# Patient Record
Sex: Male | Born: 1960 | Race: White | Hispanic: No | Marital: Married | Smoking: Former smoker
Health system: Southern US, Community
[De-identification: ages and names within clinical notes are randomized; demographics above are authoritative.]

## PROBLEM LIST (undated history)

## (undated) DIAGNOSIS — J40 Bronchitis, not specified as acute or chronic: Secondary | ICD-10-CM

## (undated) DIAGNOSIS — G540 Brachial plexus disorders: Secondary | ICD-10-CM

## (undated) DIAGNOSIS — M25511 Pain in right shoulder: Secondary | ICD-10-CM

## (undated) DIAGNOSIS — M25519 Pain in unspecified shoulder: Secondary | ICD-10-CM

## (undated) DIAGNOSIS — E1169 Type 2 diabetes mellitus with other specified complication: Principal | ICD-10-CM

## (undated) DIAGNOSIS — I10 Essential (primary) hypertension: Secondary | ICD-10-CM

## (undated) DIAGNOSIS — J418 Mixed simple and mucopurulent chronic bronchitis: Principal | ICD-10-CM

## (undated) DIAGNOSIS — E785 Hyperlipidemia, unspecified: Secondary | ICD-10-CM

## (undated) DIAGNOSIS — E119 Type 2 diabetes mellitus without complications: Secondary | ICD-10-CM

---

## 2010-07-05 ENCOUNTER — Encounter

## 2010-07-06 ENCOUNTER — Encounter

## 2014-07-05 ENCOUNTER — Emergency Department (HOSPITAL_COMMUNITY): Payer: BC Managed Care – PPO

## 2014-07-05 ENCOUNTER — Encounter (HOSPITAL_COMMUNITY): Payer: Self-pay | Admitting: Emergency Medicine

## 2014-07-05 ENCOUNTER — Encounter (HOSPITAL_COMMUNITY)
Admission: EM | Disposition: A | Discharge status: Home / Self Care | Payer: Self-pay | Source: Home / Self Care | Attending: Thoracic Surgery (Cardiothoracic Vascular Surgery)

## 2014-07-05 ENCOUNTER — Inpatient Hospital Stay (HOSPITAL_COMMUNITY)
Admission: EM | Admit: 2014-07-05 | Discharge: 2014-07-13 | DRG: 234 | Disposition: A | Discharge status: Home / Self Care | Payer: BC Managed Care – PPO | Attending: Thoracic Surgery (Cardiothoracic Vascular Surgery) | Admitting: Thoracic Surgery (Cardiothoracic Vascular Surgery)

## 2014-07-05 ENCOUNTER — Other Ambulatory Visit: Payer: Self-pay | Admitting: *Deleted

## 2014-07-05 ENCOUNTER — Inpatient Hospital Stay (HOSPITAL_COMMUNITY): Payer: BC Managed Care – PPO

## 2014-07-05 DIAGNOSIS — I2582 Chronic total occlusion of coronary artery: Secondary | ICD-10-CM | POA: Diagnosis present

## 2014-07-05 DIAGNOSIS — J9811 Atelectasis: Secondary | ICD-10-CM

## 2014-07-05 DIAGNOSIS — F17210 Nicotine dependence, cigarettes, uncomplicated: Secondary | ICD-10-CM | POA: Diagnosis present

## 2014-07-05 DIAGNOSIS — R079 Chest pain, unspecified: Secondary | ICD-10-CM | POA: Diagnosis present

## 2014-07-05 DIAGNOSIS — I214 Non-ST elevation (NSTEMI) myocardial infarction: Secondary | ICD-10-CM | POA: Diagnosis present

## 2014-07-05 DIAGNOSIS — Z01810 Encounter for preprocedural cardiovascular examination: Secondary | ICD-10-CM

## 2014-07-05 DIAGNOSIS — R000 Tachycardia, unspecified: Secondary | ICD-10-CM | POA: Diagnosis not present

## 2014-07-05 DIAGNOSIS — I509 Heart failure, unspecified: Secondary | ICD-10-CM | POA: Diagnosis present

## 2014-07-05 DIAGNOSIS — E785 Hyperlipidemia, unspecified: Secondary | ICD-10-CM | POA: Diagnosis present

## 2014-07-05 DIAGNOSIS — E1165 Type 2 diabetes mellitus with hyperglycemia: Secondary | ICD-10-CM | POA: Diagnosis present

## 2014-07-05 DIAGNOSIS — Z951 Presence of aortocoronary bypass graft: Secondary | ICD-10-CM

## 2014-07-05 DIAGNOSIS — I200 Unstable angina: Secondary | ICD-10-CM

## 2014-07-05 DIAGNOSIS — I10 Essential (primary) hypertension: Secondary | ICD-10-CM | POA: Diagnosis present

## 2014-07-05 DIAGNOSIS — E669 Obesity, unspecified: Secondary | ICD-10-CM

## 2014-07-05 DIAGNOSIS — I237 Postinfarction angina: Secondary | ICD-10-CM | POA: Diagnosis present

## 2014-07-05 DIAGNOSIS — R0602 Shortness of breath: Secondary | ICD-10-CM

## 2014-07-05 DIAGNOSIS — I25110 Atherosclerotic heart disease of native coronary artery with unstable angina pectoris: Secondary | ICD-10-CM | POA: Diagnosis present

## 2014-07-05 DIAGNOSIS — I2510 Atherosclerotic heart disease of native coronary artery without angina pectoris: Secondary | ICD-10-CM

## 2014-07-05 DIAGNOSIS — J9801 Acute bronchospasm: Secondary | ICD-10-CM | POA: Diagnosis present

## 2014-07-05 DIAGNOSIS — J449 Chronic obstructive pulmonary disease, unspecified: Secondary | ICD-10-CM | POA: Diagnosis present

## 2014-07-05 DIAGNOSIS — Z6837 Body mass index (BMI) 37.0-37.9, adult: Secondary | ICD-10-CM

## 2014-07-05 DIAGNOSIS — I359 Nonrheumatic aortic valve disorder, unspecified: Secondary | ICD-10-CM

## 2014-07-05 DIAGNOSIS — Z720 Tobacco use: Secondary | ICD-10-CM

## 2014-07-05 DIAGNOSIS — E119 Type 2 diabetes mellitus without complications: Secondary | ICD-10-CM

## 2014-07-05 HISTORY — DX: Type 2 diabetes mellitus without complications: E11.9

## 2014-07-05 HISTORY — PX: LEFT HEART CATHETERIZATION WITH CORONARY ANGIOGRAM: SHX5451

## 2014-07-05 HISTORY — DX: Hyperlipidemia, unspecified: E78.5

## 2014-07-05 LAB — GLUCOSE, CAPILLARY
GLUCOSE-CAPILLARY: 173 mg/dL — AB (ref 70–99)
GLUCOSE-CAPILLARY: 113 mg/dL — AB (ref 70–99)
Glucose-Capillary: 145 mg/dL — ABNORMAL HIGH (ref 70–99)
Glucose-Capillary: 129 mg/dL — ABNORMAL HIGH (ref 70–99)
Glucose-Capillary: 123 mg/dL — ABNORMAL HIGH (ref 70–99)
Glucose-Capillary: 147 mg/dL — ABNORMAL HIGH (ref 70–99)

## 2014-07-05 LAB — CBC WITH DIFFERENTIAL/PLATELET
Basophils Absolute: 0.1 10*3/uL (ref 0.0–0.1)
Basophils Relative: 1 % (ref 0–1)
EOS ABS: 0.2 10*3/uL (ref 0.0–0.7)
Eosinophils Relative: 2 % (ref 0–5)
HEMATOCRIT: 49.2 % (ref 39.0–52.0)
HEMOGLOBIN: 16.7 g/dL (ref 13.0–17.0)
LYMPHS ABS: 5.4 10*3/uL — AB (ref 0.7–4.0)
Lymphocytes Relative: 43 % (ref 12–46)
MCH: 31.8 pg (ref 26.0–34.0)
MCHC: 33.9 g/dL (ref 30.0–36.0)
MCV: 93.7 fL (ref 78.0–100.0)
MONOS PCT: 7 % (ref 3–12)
Monocytes Absolute: 0.9 10*3/uL (ref 0.1–1.0)
Neutro Abs: 6 10*3/uL (ref 1.7–7.7)
Neutrophils Relative %: 47 % (ref 43–77)
Platelets: 223 10*3/uL (ref 150–400)
RBC: 5.25 MIL/uL (ref 4.22–5.81)
RDW: 12.6 % (ref 11.5–15.5)
WBC: 12.6 10*3/uL — ABNORMAL HIGH (ref 4.0–10.5)

## 2014-07-05 LAB — PULMONARY FUNCTION TEST
FEF 25-75 PRE: 0.89 L/s
FEF 25-75 Post: 1.7 L/sec
FEF2575-%Change-Post: 91 %
FEF2575-%Pred-Post: 48 %
FEF2575-%Pred-Pre: 25 %
FEV1-%CHANGE-POST: 29 %
FEV1-%PRED-POST: 35 %
FEV1-%Pred-Pre: 27 %
FEV1-PRE: 1.13 L
FEV1-Post: 1.46 L
FEV1FVC-%Change-Post: -14 %
FEV1FVC-%Pred-Pre: 93 %
FEV6-%Change-Post: 49 %
FEV6-%PRED-POST: 46 %
FEV6-%PRED-PRE: 30 %
FEV6-POST: 2.35 L
FEV6-Pre: 1.57 L
FEV6FVC-%Change-Post: -1 %
FEV6FVC-%PRED-POST: 102 %
FEV6FVC-%PRED-PRE: 104 %
FVC-%Change-Post: 51 %
FVC-%Pred-Post: 44 %
FVC-%Pred-Pre: 29 %
FVC-POST: 2.38 L
FVC-PRE: 1.57 L
POST FEV6/FVC RATIO: 99 %
PRE FEV6/FVC RATIO: 100 %
Post FEV1/FVC ratio: 61 %
Pre FEV1/FVC ratio: 72 %

## 2014-07-05 LAB — BASIC METABOLIC PANEL
Anion gap: 14 (ref 5–15)
Anion gap: 11 (ref 5–15)
BUN: 10 mg/dL (ref 6–23)
BUN: 12 mg/dL (ref 6–23)
CO2: 26 mEq/L (ref 19–32)
CO2: 29 mEq/L (ref 19–32)
Calcium: 9.4 mg/dL (ref 8.4–10.5)
Calcium: 9.1 mg/dL (ref 8.4–10.5)
Chloride: 101 mEq/L (ref 96–112)
Chloride: 102 mEq/L (ref 96–112)
Creatinine, Ser: 0.7 mg/dL (ref 0.50–1.35)
Creatinine, Ser: 0.62 mg/dL (ref 0.50–1.35)
GFR calc Af Amer: >90 mL/min (ref >90)
GFR calc non Af Amer: >90 mL/min (ref >90)
GLUCOSE: 162 mg/dL — AB (ref 70–99)
Glucose, Bld: 119 mg/dL — ABNORMAL HIGH (ref 70–99)
POTASSIUM: 4.8 meq/L (ref 3.7–5.3)
Potassium: 4.1 mEq/L (ref 3.7–5.3)
Sodium: 141 mEq/L (ref 137–147)
Sodium: 142 mEq/L (ref 137–147)

## 2014-07-05 LAB — CBC
HCT: 45.2 % (ref 39.0–52.0)
Hemoglobin: 15.2 g/dL (ref 13.0–17.0)
MCH: 31.1 pg (ref 26.0–34.0)
MCHC: 33.6 g/dL (ref 30.0–36.0)
MCV: 92.6 fL (ref 78.0–100.0)
PLATELETS: 186 10*3/uL (ref 150–400)
RBC: 4.88 MIL/uL (ref 4.22–5.81)
RDW: 12.8 % (ref 11.5–15.5)
WBC: 8.4 10*3/uL (ref 4.0–10.5)

## 2014-07-05 LAB — I-STAT CHEM 8, ED
BUN: 12 mg/dL (ref 6–23)
CREATININE: 0.7 mg/dL (ref 0.50–1.35)
Calcium, Ion: 1.15 mmol/L (ref 1.12–1.23)
Chloride: 101 mEq/L (ref 96–112)
GLUCOSE: 165 mg/dL — AB (ref 70–99)
HCT: 52 % (ref 39.0–52.0)
HEMOGLOBIN: 17.7 g/dL — AB (ref 13.0–17.0)
Potassium: 3.9 mEq/L (ref 3.7–5.3)
Sodium: 141 mEq/L (ref 137–147)
TCO2: 28 mmol/L (ref 0–100)

## 2014-07-05 LAB — LIPID PANEL
Cholesterol: 168 mg/dL (ref 0–200)
HDL: 26 mg/dL — ABNORMAL LOW (ref >39)
LDL Cholesterol: 111 mg/dL — ABNORMAL HIGH (ref 0–99)
Total CHOL/HDL Ratio: 6.5 RATIO
Triglycerides: 154 mg/dL — ABNORMAL HIGH (ref <150)
VLDL: 31 mg/dL (ref 0–40)

## 2014-07-05 LAB — I-STAT TROPONIN, ED: Troponin i, poc: 0.01 ng/mL (ref 0.00–0.08)

## 2014-07-05 LAB — PROTIME-INR
INR: 1.1 (ref 0.00–1.49)
PROTHROMBIN TIME: 14.4 s (ref 11.6–15.2)

## 2014-07-05 LAB — HEMOGLOBIN A1C
Hgb A1c MFr Bld: 7.2 % — ABNORMAL HIGH (ref <5.7)
MEAN PLASMA GLUCOSE: 160 mg/dL — AB (ref <117)

## 2014-07-05 LAB — PRO B NATRIURETIC PEPTIDE
Pro B Natriuretic peptide (BNP): 10.7 pg/mL (ref 0–125)
Pro B Natriuretic peptide (BNP): 68.5 pg/mL (ref 0–125)

## 2014-07-05 LAB — TYPE AND SCREEN
ABO/RH(D): A POS
Antibody Screen: NEGATIVE

## 2014-07-05 LAB — ABO/RH: ABO/RH(D): A POS

## 2014-07-05 LAB — TSH: TSH: 0.72 u[IU]/mL (ref 0.350–4.500)

## 2014-07-05 LAB — MRSA PCR SCREENING: MRSA by PCR: NEGATIVE

## 2014-07-05 LAB — TROPONIN I
Troponin I: 18.09 ng/mL (ref <0.30)
Troponin I: 19.58 ng/mL (ref <0.30)
Troponin I: 18.08 ng/mL (ref <0.30)

## 2014-07-05 LAB — HEPARIN LEVEL (UNFRACTIONATED): Heparin Unfractionated: 0.35 IU/mL (ref 0.30–0.70)

## 2014-07-05 LAB — MAGNESIUM: Magnesium: 2 mg/dL (ref 1.5–2.5)

## 2014-07-05 SURGERY — LEFT HEART CATHETERIZATION WITH CORONARY ANGIOGRAM
Anesthesia: LOCAL

## 2014-07-05 MED ORDER — PANTOPRAZOLE SODIUM 40 MG PO TBEC
40.0000 mg | DELAYED_RELEASE_TABLET | Freq: Every day | ORAL | Status: DC
  Administered 2014-07-05 – 2014-07-06 (×2): 40 mg via ORAL
  Filled 2014-07-05 (×2): qty 1

## 2014-07-05 MED ORDER — METOPROLOL TARTRATE 1 MG/ML IV SOLN
2.5000 mg | INTRAVENOUS | Status: AC
  Administered 2014-07-05: 2.5 mg via INTRAVENOUS
  Filled 2014-07-05: qty 5

## 2014-07-05 MED ORDER — FENTANYL CITRATE 0.05 MG/ML IJ SOLN
INTRAMUSCULAR | Status: AC
  Filled 2014-07-05: qty 2

## 2014-07-05 MED ORDER — NITROGLYCERIN IN D5W 200-5 MCG/ML-% IV SOLN
0.0000 ug/min | INTRAVENOUS | Status: DC
  Administered 2014-07-05: 5 ug/min via INTRAVENOUS

## 2014-07-05 MED ORDER — HEPARIN SODIUM (PORCINE) 5000 UNIT/ML IJ SOLN
60.0000 [IU]/kg | Freq: Once | INTRAMUSCULAR | Status: AC
  Administered 2014-07-05: 4000 [IU] via INTRAVENOUS

## 2014-07-05 MED ORDER — HEPARIN SODIUM (PORCINE) 1000 UNIT/ML IJ SOLN
INTRAMUSCULAR | Status: AC
  Filled 2014-07-05: qty 1

## 2014-07-05 MED ORDER — MORPHINE SULFATE 2 MG/ML IJ SOLN
2.0000 mg | INTRAMUSCULAR | Status: DC | PRN
  Administered 2014-07-05: 2 mg via INTRAVENOUS
  Filled 2014-07-05 (×2): qty 1

## 2014-07-05 MED ORDER — METOPROLOL TARTRATE 1 MG/ML IV SOLN
2.5000 mg | INTRAVENOUS | Status: AC
  Administered 2014-07-05: 2.5 mg via INTRAVENOUS

## 2014-07-05 MED ORDER — ASPIRIN 81 MG PO CHEW: 324.0000 mg | CHEWABLE_TABLET | ORAL | Status: AC

## 2014-07-05 MED ORDER — NITROGLYCERIN 0.4 MG SL SUBL: 0.4000 mg | SUBLINGUAL_TABLET | SUBLINGUAL | Status: DC | PRN

## 2014-07-05 MED ORDER — HEPARIN SODIUM (PORCINE) 5000 UNIT/ML IJ SOLN
INTRAMUSCULAR | Status: AC
  Filled 2014-07-05: qty 1

## 2014-07-05 MED ORDER — ONDANSETRON HCL 4 MG/2ML IJ SOLN: 4.0000 mg | Freq: Four times a day (QID) | INTRAMUSCULAR | Status: DC | PRN

## 2014-07-05 MED ORDER — HEPARIN (PORCINE) IN NACL 2-0.9 UNIT/ML-% IJ SOLN
INTRAMUSCULAR | Status: AC
  Filled 2014-07-05: qty 1500

## 2014-07-05 MED ORDER — ALUM & MAG HYDROXIDE-SIMETH 200-200-20 MG/5ML PO SUSP: 15.0000 mL | ORAL | Status: DC | PRN

## 2014-07-05 MED ORDER — ASPIRIN EC 81 MG PO TBEC: 81.0000 mg | DELAYED_RELEASE_TABLET | Freq: Every day | ORAL | Status: DC

## 2014-07-05 MED ORDER — VERAPAMIL HCL 2.5 MG/ML IV SOLN
INTRAVENOUS | Status: AC
  Filled 2014-07-05: qty 2

## 2014-07-05 MED ORDER — ALUM & MAG HYDROXIDE-SIMETH 200-200-20 MG/5ML PO SUSP: 30.0000 mL | ORAL | Status: DC | PRN

## 2014-07-05 MED ORDER — HEPARIN (PORCINE) IN NACL 100-0.45 UNIT/ML-% IJ SOLN
1450.0000 [IU]/h | INTRAMUSCULAR | Status: DC
  Administered 2014-07-05: 1450 [IU]/h via INTRAVENOUS
  Filled 2014-07-05: qty 250

## 2014-07-05 MED ORDER — CHLORHEXIDINE GLUCONATE 0.12 % MT SOLN
15.0000 mL | Freq: Two times a day (BID) | OROMUCOSAL | Status: DC
  Administered 2014-07-05 – 2014-07-06 (×2): 15 mL via OROMUCOSAL
  Filled 2014-07-05 (×3): qty 15

## 2014-07-05 MED ORDER — MIDAZOLAM HCL 2 MG/2ML IJ SOLN
INTRAMUSCULAR | Status: AC
  Filled 2014-07-05: qty 2

## 2014-07-05 MED ORDER — NICOTINE 21 MG/24HR TD PT24
21.0000 mg | MEDICATED_PATCH | Freq: Every day | TRANSDERMAL | Status: DC
  Administered 2014-07-05 – 2014-07-13 (×8): 21 mg via TRANSDERMAL
  Filled 2014-07-05 (×9): qty 1

## 2014-07-05 MED ORDER — LORAZEPAM 2 MG/ML IJ SOLN
1.0000 mg | Freq: Once | INTRAMUSCULAR | Status: AC
  Administered 2014-07-05: 1 mg via INTRAVENOUS
  Filled 2014-07-05: qty 1

## 2014-07-05 MED ORDER — MORPHINE SULFATE 2 MG/ML IJ SOLN
INTRAMUSCULAR | Status: AC
  Filled 2014-07-05: qty 1

## 2014-07-05 MED ORDER — ASPIRIN 300 MG RE SUPP
300.0000 mg | RECTAL | Status: AC
  Filled 2014-07-05: qty 1

## 2014-07-05 MED ORDER — ONDANSETRON HCL 4 MG/2ML IJ SOLN
INTRAMUSCULAR | Status: AC
  Filled 2014-07-05: qty 2

## 2014-07-05 MED ORDER — METOPROLOL TARTRATE 12.5 MG HALF TABLET
12.5000 mg | ORAL_TABLET | Freq: Two times a day (BID) | ORAL | Status: DC
  Administered 2014-07-05 – 2014-07-06 (×5): 12.5 mg via ORAL
  Filled 2014-07-05 (×7): qty 1

## 2014-07-05 MED ORDER — ATORVASTATIN CALCIUM 80 MG PO TABS
80.0000 mg | ORAL_TABLET | Freq: Every day | ORAL | Status: DC
  Administered 2014-07-06 – 2014-07-12 (×6): 80 mg via ORAL
  Filled 2014-07-05 (×10): qty 1

## 2014-07-05 MED ORDER — LIDOCAINE HCL (PF) 1 % IJ SOLN
INTRAMUSCULAR | Status: AC
  Filled 2014-07-05: qty 30

## 2014-07-05 MED ORDER — MORPHINE SULFATE 2 MG/ML IJ SOLN
2.0000 mg | Freq: Once | INTRAMUSCULAR | Status: AC
  Administered 2014-07-05: 2 mg via INTRAVENOUS

## 2014-07-05 MED ORDER — HEPARIN (PORCINE) IN NACL 100-0.45 UNIT/ML-% IJ SOLN
1650.0000 [IU]/h | INTRAMUSCULAR | Status: DC
  Administered 2014-07-05: 1450 [IU]/h via INTRAVENOUS
  Filled 2014-07-05 (×5): qty 250

## 2014-07-05 MED ORDER — ALBUTEROL SULFATE (2.5 MG/3ML) 0.083% IN NEBU
2.5000 mg | INHALATION_SOLUTION | Freq: Once | RESPIRATORY_TRACT | Status: AC
  Administered 2014-07-05: 2.5 mg via RESPIRATORY_TRACT

## 2014-07-05 MED ORDER — ACETAMINOPHEN 325 MG PO TABS
650.0000 mg | ORAL_TABLET | ORAL | Status: DC | PRN
Start: 2014-07-05 — End: 2014-07-07

## 2014-07-05 MED ORDER — CETYLPYRIDINIUM CHLORIDE 0.05 % MT LIQD
7.0000 mL | Freq: Two times a day (BID) | OROMUCOSAL | Status: DC
  Administered 2014-07-05 (×2): 7 mL via OROMUCOSAL

## 2014-07-05 MED ORDER — NITROGLYCERIN 1 MG/10 ML FOR IR/CATH LAB
INTRA_ARTERIAL | Status: AC
  Filled 2014-07-05: qty 10

## 2014-07-05 MED ORDER — INSULIN ASPART 100 UNIT/ML ~~LOC~~ SOLN
0.0000 [IU] | Freq: Three times a day (TID) | SUBCUTANEOUS | Status: DC
  Administered 2014-07-05 – 2014-07-06 (×3): 2 [IU] via SUBCUTANEOUS

## 2014-07-05 MED ORDER — SODIUM CHLORIDE 0.9 % IV SOLN: INTRAVENOUS | Status: AC

## 2014-07-05 MED ORDER — ONDANSETRON HCL 4 MG/2ML IJ SOLN
4.0000 mg | Freq: Once | INTRAMUSCULAR | Status: AC
  Administered 2014-07-05: 4 mg via INTRAVENOUS

## 2014-07-05 MED ORDER — SODIUM CHLORIDE 0.9 % IV SOLN
INTRAVENOUS | Status: DC
  Administered 2014-07-05: 10 mL/h via INTRAVENOUS

## 2014-07-05 NOTE — Progress Notes (Addendum)
ANTICOAGULATION CONSULT NOTE - Follow Up Consult  Pharmacy Consult for Heparin Indication: Chest pain / ACS  No Known Allergies  Patient Measurements: Height: 6' (182.9 cm) Weight: 276 lb 3.8 oz (125.3 kg) IBW/kg (Calculated) : 77.6 Heparin Dosing Weight: 105 kg  Vital Signs: Temp: 97.6 F (36.4 C) (11/17 0713) Temp Source: Oral (11/17 0713) BP: 74/51 mmHg (11/17 0900) Pulse Rate: 73 (11/17 0900)  Labs:  Recent Labs  07/05/14 0041 07/05/14 0052 07/05/14 0850  HGB 16.7 17.7* 15.2  HCT 49.2 52.0 45.2  PLT 223  --  186  LABPROT  --   --  14.4  INR  --   --  1.10  HEPARINUNFRC  --   --  0.35  CREATININE 0.70 0.70 0.62  TROPONINI <0.30  --  18.09*    Estimated Creatinine Clearance: 146.1 mL/min (by C-G formula based on Cr of 0.62).  Assessment: 53 yo M presents on 11/17 with severe CP associated with SOB. EKG initially NSR, diffuse ST depressions in inferior leads, ST elevation in aVR. Tnl 0.01. CBC stable, Hgb slightly elevated at 17.7, plts 223. HL is therapeutic at 0.35. No s/s of bleed. Going for cath today.  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 Recommended for CABG. Plan is to restart heparin 8 hours post sheath removal which will occur at approximately 1330 today. HL was therapeutic at 0.35 earlier today with heparin running at 1,450 units/hr  Plan: No BOLUS Restart heparin gtt at 1,450 units/hr for 2130 tonight Check ~6 hour HL with am labs Monitor daily HL, CBC, s/s of bleed F/U CABG plans

## 2014-07-05 NOTE — H&P (Addendum)
Patient ID: Antonio MatesWilliam Cordova MRN: [id number], DOB/AGE: 18-Dec-1960   Admit date: 07/05/2014   Primary Physician: No primary care provider on file. Primary Cardiologist: None  Pt. Profile:  53M smoker with DM, HLD who p/w severe chest pain and diffuse ECG changes  Problem List  Past Medical History  Diagnosis Date  . Diabetes mellitus without complication   . Hyperlipidemia     History reviewed. No pertinent past surgical history.   Allergies  No Known Allergies  HPI  53M smoker with DM, HLD who p/w severe chest pain and diffuse ECG changes.  Antonio Cordova was in [place] went to bed and awoke at 11pm with centrally located crushing SSCP "it hurt really bad" with associated SOB. Initially 3/10 in severity. He has never had this type of symptom before. Pain 11/10 at its most severe. He did have left arm numbness but this is a chronic issue that occurred after a biceps surgery. He called EMS immediately. He was given ASA in the truck.   On arrival to the ED he was hemodynamically stable, saturating in the mid 90s on RA. ECG initially demonstrated NSR, diffuse ST depressions in inferior leads and V3-V6. STE aVR. old ASMI which improved slightly by the next ECG at 12:44am. He was started on IV heparin and IV NTG and was given morphine and zofran. Initial work-up notable for  K 3.9, Cr 0.70, POC TnI 0.01, CXR with mild pulm edema.   He denies a history of CP but states he does have DOE which occurs after walking 1/2 length of a football field. He can just get up a flight of 10 stairs without needing to take a rest. No history of bleeding or CVA. He has a mother and grandmother with DM. No CV disease among parents or siblings. Has smoked 1.5PPD for most of his life.   He is married and has a Microbiologiststepson. 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   On my interview he still reported 3/10 chest pain.   Home Medications  Prior to Admission medications     Not on File  Tylenol  Family History  History reviewed. No pertinent family history.  Social History  History   Social History  . Marital Status: Unknown    Spouse Name: N/A    Number of Children: N/A  . Years of Education: N/A   Occupational History  . Not on file.   Social History Main Topics  . Smoking status: Current Every Day Smoker -- 1.05 packs/day  . Smokeless tobacco: Never Used  . Alcohol Use: Yes  . Drug Use: Not on file  . Sexual Activity: Not on file   Other Topics Concern  . Not on file   Social History Narrative  . No narrative on file     Review of Systems General:  No chills, fever, night sweats or weight changes.  Cardiovascular:  + chest pain, dyspnea on exertion. No edema, orthopnea, palpitations, paroxysmal nocturnal dyspnea. Dermatological: No rash, lesions/masses Respiratory: No cough, dyspnea Urologic: No hematuria, dysuria Abdominal:   No nausea, vomiting, diarrhea, bright red blood per rectum, melena, or hematemesis Neurologic:  No visual changes, wkns, changes in mental status. All other systems reviewed and are otherwise negative except as noted above.  Physical Exam  Blood pressure 114/84, pulse 81, temperature 97.4 F (36.3 C), temperature source Oral, resp. rate 26, height 6' (1.829 m), weight 122.471 kg (270 lb), SpO2 94 %.  General: Moderate distress, fidgeting Psych: anxious Neuro: Alert and oriented X 3. Moves all extremities spontaneously. HEENT: Normal  Neck: Supple without bruits or JVD. Lungs:  Mildly tachypneic. Basilar crackles.  Heart: RRR no s3, s4, or murmurs. Abdomen: Soft, non-tender, non-distended, BS + x 4.  Extremities: No clubbing, cyanosis or edema. DP/PT/Radials 2+ and equal bilaterally.  Labs  Troponin [facility](Point of Care Test)  Recent Labs  07/05/14 0049  TROPIPOC 0.01   No results for input(s): CKTOTAL, CKMB, TROPONINI in the last 72 hours. Lab Results  Component Value Date   WBC 12.6* 07/05/2014    HGB 17.7* 07/05/2014   HCT 52.0 07/05/2014   MCV 93.7 07/05/2014   PLT 223 07/05/2014    Recent Labs Lab 07/05/14 0052  NA 141  K 3.9  CL 101  BUN 12  CREATININE 0.70  GLUCOSE 165*   No results found for: CHOL, HDL, LDLCALC, TRIG No results found for: DDIMER   Radiology/Studies  Dg Chest Port 1 View  07/05/2014   CLINICAL DATA:  Chest pain and shortness of breath  EXAM: PORTABLE CHEST - 1 VIEW  COMPARISON:  None.  FINDINGS: Cardiac shadow is within normal limits. Increased vascular congestion is noted without significant pulmonary edema. No focal infiltrate is seen. No bony abnormality is noted.  IMPRESSION: Mild vascular congestion.   Electronically Signed   By: Alcide CleverMark  Lukens M.D.   On: 07/05/2014 01:00    ECG 07/05/14 12:39 NSR, diffuse ST depressions in interior leads and V3-V6. STE aVR. old ASMI.  07/05/14 12:44 NSR, old ASMI, borderline downsloping STD in inferiorleads and V4-V6. TWI in V4-V6. TW flattening aVL  ASSESSMENT AND PLAN  53M smoker with DM, HLD who p/w severe chest pain and diffuse ECG changes. The presentation is very concerning for unstable anginal in the setting of LM, pLAD, or multivessel CAD based on presentation and ECG. His CXR and lung exam are consistent with mild CHF. He is not in shock and is improving somewhat with IV nitro and heparin. Will continue to try to cool off with medications but he may require emergent angiography overnight.   1. NPO for cardiac cath 2. Admit to ICU 3. Continue IV UFH and NTG 4. ASA 5. Metoprolol 12.5mg  BID 6. Atorvastatin 80mg  7. Echo ordered 8. A1c, lipids 9. Nicotine patch, smoking cessation counseling   Signed, Glori LuisFRIEDMAN, Arleigh Odowd, MD 07/05/2014, 1:08 AM

## 2014-07-05 NOTE — Progress Notes (Signed)
CRITICAL VALUE ALERT  Critical value receivd: Trop 18.09       Date of notification:  07/05/14  Time of notification:  1000  Critical value read back yes  Nurse who received alert: P. Mikle BosworthAlvord RN  MD notified (1st page): Dr. SwazilandJordan  Time of first page:  1000  MD notified (2nd page):  Time of second page:    Time MD responded:

## 2014-07-05 NOTE — CV Procedure (Signed)
    Cardiac Catheterization Procedure Note  Name: Antonio MatesWilliam Cordova MRN: [id number] DOB: Mar 11, 1961  Procedure: Left Heart Cath, Selective Coronary Angiography, LV angiography  Indication: 53 yo WM with history of DM presents with unstable angina.   Procedural Details: The right wrist was prepped, draped, and anesthetized with 1% lidocaine. Using the modified Seldinger technique, a 6 French slender sheath was introduced into the right radial artery. 3 mg of verapamil was administered through the sheath, weight-based unfractionated heparin was administered intravenously. Standard Judkins catheters were used for selective coronary angiography and left ventriculography. Catheter exchanges were performed over an exchange length guidewire. There were no immediate procedural complications. A TR band was used for radial hemostasis at the completion of the procedure.  The patient was transferred to the post catheterization recovery area for further monitoring.  Procedural Findings: Hemodynamics: AO 93/64 mean 77 mm Hg LV 95/20 mm Hg  Coronary angiography: Coronary dominance: right  Left mainstem: The left main is calcified with 30-40% distal disease.   Left anterior descending (LAD): The LAD is heavily calcified with severe diffuse  proximal disease up to 90%. There is a 90% stenosis at the takeoff of the second diagonal.   Ramus intermediate: this is a large branch with diffuse 80% stenosis in the proximal vessel and 70% stenosis in the mid vessel.   Left circumflex (LCx): 100% occluded at the ostium. There are left to left collaterals to an OM branch and right to left collaterals to the distal LCx  Right coronary artery (RCA): The RCA is heavily calcified. There is an 80% proximal stenosis followed by 70% disease in the mid vessel.   Left ventriculography: Left ventricular systolic function is abnormal, there is mid anterior HK. LVEF is estimated at 45%, there is no significant mitral  regurgitation   Final Conclusions:   1. Severe 3 vessel obstructive CAD  2. Mild to moderate LV dysfunction.  Recommendations: Recommend consideration for CABG.   Burlie Cajamarca SwazilandJordan, MDFACC  07/05/2014, 10:25 AM

## 2014-07-05 NOTE — Plan of Care (Signed)
Problem: Phase I Progression Outcomes Goal: Aspirin unless contraindicated Outcome: Completed/Met Date Met:  07/05/14

## 2014-07-05 NOTE — ED Notes (Addendum)
Patient with chest pain that woke him from sleep.  Patient was pale and diaphoretic on EMS arrival.  Patient with nausea and some moments of anxiety and altered mental status.  Patient is in [place] on business.  Patient was given 324mg  ASA and one SL nitro en route to ED.

## 2014-07-05 NOTE — Progress Notes (Signed)
VASCULAR LAB PRELIMINARY  PRELIMINARY  PRELIMINARY  PRELIMINARY  Pre-op Cardiac Surgery  Carotid Findings:  Bilateral:  1-39% ICA stenosis.  Vertebral artery flow is antegrade.     Antonio ParkinHelene Shivank Cordova, RVT 07/05/2014 2:02 PM     Upper Extremity Right Left  Brachial Pressures    Radial Waveforms Pending    Ulnar Waveforms    Palmar Arch ([name]'s Test)     Findings:      Lower  Extremity Right Left  Dorsalis Pedis    Anterior Tibial    Posterior Tibial    Ankle/Brachial Indices      Findings:

## 2014-07-05 NOTE — Progress Notes (Signed)
ANTICOAGULATION CONSULT NOTE - Initial Consult  Pharmacy Consult for Heparin Indication: chest pain/ACS  No Known Allergies  Patient Measurements: Height: 6' (182.9 cm) Weight: 270 lb (122.471 kg) IBW/kg (Calculated) : 77.6  Vital Signs: Temp: 97.4 F (36.3 C) (11/17 0036) Temp Source: Oral (11/17 0036) BP: 114/84 mmHg (11/17 0040) Pulse Rate: 81 (11/17 0041)  Labs:  Recent Labs  07/05/14 0041 07/05/14 0052  HGB 16.7 17.7*  HCT 49.2 52.0  PLT 223  --   CREATININE  --  0.70    Estimated Creatinine Clearance: 144.4 mL/min (by C-G formula based on Cr of 0.7).   Medical History: Past Medical History  Diagnosis Date  . Diabetes mellitus without complication   . Hyperlipidemia     Medications:  None  Assessment: 53 y.o. male with chest pain for heparin   Goal of Therapy:  Heparin level 0.3-0.7 units/ml Monitor platelets by anticoagulation protocol: Yes   Plan:  Heparin 4000 units IV bolus, then start heparin 1450 units/hr Check heparin level in 6 hours.   Eddie Candlebbott, Hamed Debella Vernon 07/05/2014,1:03 AM

## 2014-07-05 NOTE — Plan of Care (Signed)
Problem: Phase I Progression Outcomes Goal: Hemodynamically stable Outcome: Completed/Met Date Met:  07/05/14     

## 2014-07-05 NOTE — Plan of Care (Signed)
Problem: Phase I Progression Outcomes Goal: MD aware of Cardiac Marker results Outcome: Completed/Met Date Met:  07/05/14

## 2014-07-05 NOTE — Interval H&P Note (Signed)
History and Physical Interval Note:  07/05/2014 9:40 AM  Antonio MatesWilliam Casique  has presented today for surgery, with the diagnosis of cp  The various methods of treatment have been discussed with the patient and family. After consideration of risks, benefits and other options for treatment, the patient has consented to  Procedure(s): LEFT HEART CATHETERIZATION WITH CORONARY ANGIOGRAM (N/A) as a surgical intervention .  The patient's history has been reviewed, patient examined, no change in status, stable for surgery.  I have reviewed the patient's chart and labs.  Questions were answered to the patient's satisfaction.   Cath Lab Visit (complete for each Cath Lab visit)  Clinical Evaluation Leading to the Procedure:   ACS: Yes.    Non-ACS:    Anginal Classification: CCS IV  Anti-ischemic medical therapy: Minimal Therapy (1 class of medications)  Non-Invasive Test Results: No non-invasive testing performed  Prior CABG: No previous CABG        Theron Aristaeter Memorial Hospital Of Carbon CountyJordanMD,FACC 07/05/2014 9:40 AM

## 2014-07-05 NOTE — Progress Notes (Addendum)
Late entry from earlier this morning. The patient was seen on CCU rounds at approximately 8:45 AM.  Patient Name: Antonio Cordova Date of Encounter: 07/05/2014    SUBJECTIVE:  The patient was seen on rounds this morning and it had several bouts of chest pain since admission to the hospital. The patient's profile included significant risk factors for CAD. Despite normal cardiac markers since admission, his course was compatible with unstable angina pectoris. We set the patient up for coronary angiography after speaking with Dr. SwazilandJordan. Orders were written. The procedure and risks were discussed in detail with the patient.  TELEMETRY:  Normal sinus rhythm with occasional nonsustained runs of VT Filed Vitals:   07/05/14 1102 07/05/14 1200 07/05/14 1300 07/05/14 1400  BP: 114/79 107/76  87/68  Pulse: 73 87 72 85  Temp: 97.3 F (36.3 C)     TempSrc: Oral     Resp: 16 15 14 20   Height:      Weight:      SpO2: 97% 98% 98% 97%    Intake/Output Summary (Last 24 hours) at 07/05/14 1437 Last data filed at 07/05/14 1400  Gross per 24 hour  Intake 1013.91 ml  Output    850 ml  Net 163.91 ml   LABS: Basic Metabolic Panel:  Recent Labs  47/42/5911/17/15 0041 07/05/14 0052 07/05/14 0850  NA 141 141 142  K 4.1 3.9 4.8  CL 101 101 102  CO2 26  --  29  GLUCOSE 162* 165* 119*  BUN 12 12 10   CREATININE 0.70 0.70 0.62  CALCIUM 9.4  --  9.1  MG  --   --  2.0   CBC:  Recent Labs  07/05/14 0041 07/05/14 0052 07/05/14 0850  WBC 12.6*  --  8.4  NEUTROABS 6.0  --   --   HGB 16.7 17.7* 15.2  HCT 49.2 52.0 45.2  MCV 93.7  --  92.6  PLT 223  --  186   Cardiac Enzymes:  Recent Labs  07/05/14 0041 07/05/14 0850  TROPONINI <0.30 18.09*   BNP    Component Value Date/Time   PROBNP 68.5 07/05/2014 0850    Fasting Lipid Panel:  Recent Labs  07/05/14 0850  CHOL 168  HDL 26*  LDLCALC 111*  TRIG 154*  CHOLHDL 6.5    Radiology/Studies:  Chest x-ray reveals mild vascular  congestion  Physical Exam: Blood pressure 87/68, pulse 85, temperature 97.3 F (36.3 C), temperature source Oral, resp. rate 20, height 6' (1.829 m), weight 276 lb 3.8 oz (125.3 kg), SpO2 97 %. Weight change:   Wt Readings from Last 3 Encounters:  07/05/14 276 lb 3.8 oz (125.3 kg)    Audible S4 gallop on cardiac auscultation Faint bibasal rales are heard. Also coarse expiratory wheezes are heard. Difficult to evaluate neck veins. No peripheral edema. Radial and posterior tibial pulses are 2+ and symmetric Neurologically intact  ASSESSMENT:  1. Unstable angina pectoris with waxing and waning episodes of chest discomfort. Cardiac markers were negative at the time of initial evaluation when the decision to perform angiography was made. Subsequently, blood work drawn at the time that we were evaluating the patient has come back showing a troponin of 18.09. The patient remains pain free. 2. Diabetes mellitus, poorly controlled 3. Heavy tobacco abuse 4. Hypertension  Plan:  Angiograms were reviewed with Dr. SwazilandJordan. Given the patient's diabetes and the diffuse nature of coronary disease, I believe his best long-term option would be coronary revascularization  with surgery.  Continue IV heparin  Add anti-ischemic therapy to include IV nitroglycerin and beta blocker therapy.  Selinda EonSigned, [name],[name] 07/05/2014, 2:37 PM

## 2014-07-05 NOTE — Progress Notes (Signed)
  Echocardiogram 2D Echocardiogram has been performed.  Leta JunglingCooper, Lurlie Wigen M 07/05/2014, 4:35 PM

## 2014-07-05 NOTE — Care Management Note (Signed)
    Page 1 of 1   07/05/2014     8:55:58 AM CARE MANAGEMENT NOTE 07/05/2014  Patient:  Marcelline MatesMARTIN,Paolo   Account Number:  [id number]  Date Initiated:  07/05/2014  Documentation initiated by:  Junius CreamerWELL,DEBBIE  Subjective/Objective Assessment:   adm w angina     Action/Plan:   lives w fam   Anticipated DC Date:     Anticipated DC Plan:           Choice offered to / List presented to:             Status of service:   Medicare Important Message given?   (If response is "NO", the following Medicare IM given date fields will be blank) Date Medicare IM given:   Medicare IM given by:   Date Additional Medicare IM given:   Additional Medicare IM given by:    Discharge Disposition:    Per UR Regulation:  Reviewed for med. necessity/level of care/duration of stay  If discussed at Long Length of Stay Meetings, dates discussed:    Comments:

## 2014-07-05 NOTE — ED Notes (Signed)
The patient called because he was having chest pain.  Patient denies any medical history.  According to EMS the patient is here on business and he advised them the pain woke him out of his sleep.  Patient also had SOB, diaphoresis, ALOC, but denies any other symptoms.  EMS gave a breathing treatment, one nitro and 324mg  of Aspirin.

## 2014-07-05 NOTE — Progress Notes (Signed)
eLink Physician-Brief Progress Note Patient Name: Antonio MatesWilliam Cordova DOB: October 25, 1960 MRN: [id number]   Date of Service  07/05/2014  HPI/Events of Note  Antonio Antonio Cordova is a [age] M admitted with unstable angina and diffuse EKG changes.  CXR shows mild pulmonary edema.  Currently on heparin and nitroglycerin gtts.  CC is scheduled for this AM.  He is HD stable with adequate O2 sats and is not in any resp distress.  eICU Interventions  Plan: Continue to monitor Plan of care per cardiology     Intervention Category Evaluation Type: New Patient Evaluation  Antonio Cordova 07/05/2014, 3:21 AM

## 2014-07-05 NOTE — ED Provider Notes (Signed)
CSN: [id number]     Arrival date & time 07/05/14  0035 History   First MD Initiated Contact with Patient 07/05/14 0042     Chief Complaint  Patient presents with  . Chest Pain    The patient called because he was having chest pain.  Patient denies any medical history.     (Consider location/radiation/quality/duration/timing/severity/associated sxs/prior Treatment) HPI 53 yo male presents to the ER from home with complaint of chest pain that woke from sleep.  No prior h/o same.  Pt reports he tried to get up and move around but did not have improvement.  He is unsure when he woke.  Call to 911 at 1154 pm.  Pt has been diaphoretic with EMS, c/o central chest pain, sob.  Pt received 1 ntg and aspirin without improvement.  Pt does not see a doctor on a regular basis, was told "years ago" he had diabetes.  Pt smokesx 1.5 ppd.  Denies family history of CAD.  Pt reports over the last few days he has had DOE and some lower extremity edema.   History reviewed. No pertinent past medical history. History reviewed. No pertinent past surgical history. History reviewed. No pertinent family history. History  Substance Use Topics  . Smoking status: Current Every Day Smoker  . Smokeless tobacco: Not on file  . Alcohol Use: Not on file    Review of Systems  Unable to perform ROS: Acuity of condition      Allergies  Review of patient's allergies indicates no known allergies.  Home Medications   Prior to Admission medications   Not on File   BP 114/84 mmHg  Pulse 81  Temp(Src) 97.4 F (36.3 C) (Oral)  Resp 26  SpO2 94% Physical Exam  Constitutional: He is oriented to person, place, and time. He appears well-developed and well-nourished. He appears distressed.  HENT:  Head: Normocephalic and atraumatic.  Nose: Nose normal.  Mouth/Throat: Oropharynx is clear and moist.  Eyes: Conjunctivae and EOM are normal. Pupils are equal, round, and reactive to light.  Neck: Normal range of motion.  Neck supple. No JVD present. No tracheal deviation present. No thyromegaly present.  Cardiovascular: Normal rate, regular rhythm, normal heart sounds and intact distal pulses.  Exam reveals no gallop and no friction rub.   No murmur heard. Pulmonary/Chest: Effort normal. No stridor. No respiratory distress. He has no wheezes. He has rales. He exhibits no tenderness.  Abdominal: Soft. Bowel sounds are normal. He exhibits no distension and no mass. There is no tenderness. There is no rebound and no guarding.  Musculoskeletal: Normal range of motion. He exhibits no edema or tenderness.  Lymphadenopathy:    He has no cervical adenopathy.  Neurological: He is alert and oriented to person, place, and time. He displays normal reflexes. He exhibits normal muscle tone. Coordination normal.  Skin: Skin is warm. No rash noted. He is diaphoretic. No erythema. No pallor.  Psychiatric: He has a normal mood and affect. His behavior is normal. Judgment and thought content normal.  Nursing note and vitals reviewed.   ED Course  Procedures (including critical care time) Labs Review Labs Reviewed  CBC WITH DIFFERENTIAL - Abnormal; Notable for the following:    WBC 12.6 (*)    Lymphs Abs 5.4 (*)    All other components within normal limits  BASIC METABOLIC PANEL - Abnormal; Notable for the following:    Glucose, Bld 162 (*)    All other components within normal limits  GLUCOSE, CAPILLARY -  Abnormal; Notable for the following:    Glucose-Capillary 173 (*)    All other components within normal limits  I-STAT CHEM 8, ED - Abnormal; Notable for the following:    Glucose, Bld 165 (*)    Hemoglobin 17.7 (*)    All other components within normal limits  MRSA PCR SCREENING  PRO B NATRIURETIC PEPTIDE  TROPONIN I  BASIC METABOLIC PANEL  CBC  PROTIME-INR  HEMOGLOBIN A1C  TROPONIN I  TROPONIN I  TROPONIN I  TSH  MAGNESIUM  LIPID PANEL  PRO B NATRIURETIC PEPTIDE  HEPARIN LEVEL (UNFRACTIONATED)  I-STAT  TROPOININ, ED  I-STAT TROPOININ, ED  I-STAT TROPOININ, ED  TYPE AND SCREEN    Imaging Review Dg Chest Port 1 View  07/05/2014   CLINICAL DATA:  Chest pain and shortness of breath  EXAM: PORTABLE CHEST - 1 VIEW  COMPARISON:  None.  FINDINGS: Cardiac shadow is within normal limits. Increased vascular congestion is noted without significant pulmonary edema. No focal infiltrate is seen. No bony abnormality is noted.  IMPRESSION: Mild vascular congestion.   Electronically Signed   By: Alcide CleverMark  Lukens M.D.   On: 07/05/2014 01:00     EKG Interpretation   Date/Time:  Tuesday July 05 2014 00:39:21 EST Ventricular Rate:  71 PR Interval:  171 QRS Duration: 83 QT Interval:  359 QTC Calculation: 390 R Axis:   14 Text Interpretation:  Sinus rhythm Atrial premature complexes in couplets  Consider left atrial enlargement Low voltage, precordial leads  Anteroseptal infarct, old Repol abnrm, severe global ischemia (LM/MVD)  Baseline wander in lead(s) II III aVF V1 V2 V3 V4 V5 V6 No old tracing to  compare diffuse ST depression, concern for acute ischemia Confirmed by  Kevin Space  MD, Krist Rosenboom ([id number]) on 07/05/2014 12:45:26 AM     CRITICAL CARE Performed by: Olivia MackieTTER,Trenna Kiely M Total critical care time:60 min Critical care time was exclusive of separately billable procedures and treating other patients. Critical care was necessary to treat or prevent imminent or life-threatening deterioration. Critical care was time spent personally by me on the following activities: development of treatment plan with patient and/or surrogate as well as nursing, discussions with consultants, evaluation of patient's response to treatment, examination of patient, obtaining history from patient or surrogate, ordering and performing treatments and interventions, ordering and review of laboratory studies, ordering and review of radiographic studies, pulse oximetry and re-evaluation of patient's condition.  MDM   Final diagnoses:   Chest pain   53 yo male with acute onset of chest pain, having ongoing severe crushing central chest pain.  Denies back pain.  Concern for ACS, unstable angina.  Doubt dissection, less likely PE.    1:03 AM Case d/w on call cardiology, Dr Haskell RilingFreedman who will see in the ED for consult.  Plan for admission. Pt on NTG drip, heparin drip.  Pain improved with morphine, ativan.  Olivia Mackielga M Alondra Sahni, MD 07/05/14 [phone]

## 2014-07-05 NOTE — Plan of Care (Signed)
Problem: Consults Goal: Cardiac Cath Patient Education (See Patient Education module for education specifics.) Outcome: Completed/Met Date Met:  07/05/14 Goal: Diabetes Guidelines if Diabetic/Glucose > 140 If diabetic or lab glucose is > 140 mg/dl - Initiate Diabetes/Hyperglycemia Guidelines & Document Interventions  Outcome: Completed/Met Date Met:  07/05/14  Problem: Phase I Progression Outcomes Goal: Pain controlled with appropriate interventions Outcome: Completed/Met Date Met:  07/05/14 Goal: Voiding-avoid urinary catheter unless indicated Outcome: Completed/Met Date Met:  07/05/14 Goal: Hemodynamically stable Outcome: Completed/Met Date Met:  07/05/14 Goal: Distal pulses equal to baseline Outcome: Completed/Met Date Met:  07/05/14 Goal: Vascular site scale level 0 - I Vascular Site Scale Level 0: No bruising/bleeding/hematoma Level I (Mild): Bruising/Ecchymosis, minimal bleeding/ooozing, palpable hematoma < 3 cm Level II (Moderate): Bleeding not affecting hemodynamic parameters, pseudoaneurysm, palpable hematoma > 3 cm Level III (Severe) Bleeding which affects hemodynamic parameters or retroperitoneal hemorrhage  Outcome: Completed/Met Date Met:  07/05/14

## 2014-07-06 DIAGNOSIS — J449 Chronic obstructive pulmonary disease, unspecified: Secondary | ICD-10-CM

## 2014-07-06 DIAGNOSIS — I25110 Atherosclerotic heart disease of native coronary artery with unstable angina pectoris: Secondary | ICD-10-CM

## 2014-07-06 LAB — CBC
HEMATOCRIT: 44.2 % (ref 39.0–52.0)
HEMOGLOBIN: 14.4 g/dL (ref 13.0–17.0)
MCH: 30.6 pg (ref 26.0–34.0)
MCHC: 32.6 g/dL (ref 30.0–36.0)
MCV: 93.8 fL (ref 78.0–100.0)
Platelets: 165 10*3/uL (ref 150–400)
RBC: 4.71 MIL/uL (ref 4.22–5.81)
RDW: 12.9 % (ref 11.5–15.5)
WBC: 7.7 10*3/uL (ref 4.0–10.5)

## 2014-07-06 LAB — GLUCOSE, CAPILLARY
GLUCOSE-CAPILLARY: 144 mg/dL — AB (ref 70–99)
Glucose-Capillary: 135 mg/dL — ABNORMAL HIGH (ref 70–99)
Glucose-Capillary: 123 mg/dL — ABNORMAL HIGH (ref 70–99)
Glucose-Capillary: 133 mg/dL — ABNORMAL HIGH (ref 70–99)

## 2014-07-06 LAB — COMPREHENSIVE METABOLIC PANEL
ALK PHOS: 59 U/L (ref 39–117)
ALT: 36 U/L (ref 0–53)
AST: 87 U/L — ABNORMAL HIGH (ref 0–37)
Albumin: 3.5 g/dL (ref 3.5–5.2)
Anion gap: 14 (ref 5–15)
BILIRUBIN TOTAL: 0.5 mg/dL (ref 0.3–1.2)
BUN: 7 mg/dL (ref 6–23)
CHLORIDE: 101 meq/L (ref 96–112)
CO2: 22 mEq/L (ref 19–32)
Calcium: 8.8 mg/dL (ref 8.4–10.5)
Creatinine, Ser: 0.61 mg/dL (ref 0.50–1.35)
GFR calc non Af Amer: >90 mL/min (ref >90)
GLUCOSE: 122 mg/dL — AB (ref 70–99)
POTASSIUM: 4.7 meq/L (ref 3.7–5.3)
SODIUM: 137 meq/L (ref 137–147)
Total Protein: 6.1 g/dL (ref 6.0–8.3)

## 2014-07-06 LAB — HIV ANTIBODY (ROUTINE TESTING W REFLEX): HIV 1&2 Ab, 4th Generation: NONREACTIVE

## 2014-07-06 LAB — HEPARIN LEVEL (UNFRACTIONATED)
HEPARIN UNFRACTIONATED: 0.39 [IU]/mL (ref 0.30–0.70)
Heparin Unfractionated: 0.18 IU/mL — ABNORMAL LOW (ref 0.30–0.70)

## 2014-07-06 MED ORDER — DEXTROSE 5 % IV SOLN
1.5000 g | INTRAVENOUS | Status: AC
  Administered 2014-07-07: 1.5 g via INTRAVENOUS
  Administered 2014-07-07: .75 g via INTRAVENOUS
  Filled 2014-07-06: qty 1.5

## 2014-07-06 MED ORDER — VANCOMYCIN HCL 10 G IV SOLR
1250.0000 mg | INTRAVENOUS | Status: AC
  Administered 2014-07-07: 1250 mg via INTRAVENOUS
  Filled 2014-07-06: qty 1250

## 2014-07-06 MED ORDER — MAGNESIUM SULFATE 50 % IJ SOLN
40.0000 meq | INTRAMUSCULAR | Status: DC
  Filled 2014-07-06: qty 10

## 2014-07-06 MED ORDER — BISACODYL 5 MG PO TBEC
10.0000 mg | DELAYED_RELEASE_TABLET | Freq: Once | ORAL | Status: AC
  Administered 2014-07-06: 10 mg via ORAL
  Filled 2014-07-06: qty 2

## 2014-07-06 MED ORDER — SODIUM CHLORIDE 0.9 % IV SOLN
INTRAVENOUS | Status: AC
  Administered 2014-07-07: 2 [IU]/h via INTRAVENOUS
  Filled 2014-07-06: qty 2.5

## 2014-07-06 MED ORDER — DEXTROSE 5 % IV SOLN
0.0000 ug/min | INTRAVENOUS | Status: DC
  Filled 2014-07-06: qty 4

## 2014-07-06 MED ORDER — PLASMA-LYTE 148 IV SOLN
INTRAVENOUS | Status: AC
  Administered 2014-07-07: 500 mL
  Filled 2014-07-06: qty 2.5

## 2014-07-06 MED ORDER — CHLORHEXIDINE GLUCONATE CLOTH 2 % EX PADS
6.0000 | MEDICATED_PAD | Freq: Once | CUTANEOUS | Status: AC
  Administered 2014-07-06: 6 via TOPICAL

## 2014-07-06 MED ORDER — DEXMEDETOMIDINE HCL IN NACL 400 MCG/100ML IV SOLN
0.1000 ug/kg/h | INTRAVENOUS | Status: AC
Start: 2014-07-07 — End: 2014-07-07
  Administered 2014-07-07: 0.7 ug/kg/h via INTRAVENOUS
  Filled 2014-07-06: qty 100

## 2014-07-06 MED ORDER — SODIUM CHLORIDE 0.9 % IV SOLN
INTRAVENOUS | Status: AC
  Administered 2014-07-07: 69 mL/h via INTRAVENOUS
  Filled 2014-07-06: qty 40

## 2014-07-06 MED ORDER — NITROGLYCERIN IN D5W 200-5 MCG/ML-% IV SOLN
2.0000 ug/min | INTRAVENOUS | Status: DC
  Filled 2014-07-06: qty 250

## 2014-07-06 MED ORDER — DIAZEPAM 5 MG PO TABS
5.0000 mg | ORAL_TABLET | Freq: Once | ORAL | Status: AC
  Administered 2014-07-07: 5 mg via ORAL
  Filled 2014-07-06: qty 1

## 2014-07-06 MED ORDER — DEXTROSE 5 % IV SOLN
750.0000 mg | INTRAVENOUS | Status: DC
  Filled 2014-07-06 (×2): qty 750

## 2014-07-06 MED ORDER — ASPIRIN 325 MG PO TABS
325.0000 mg | ORAL_TABLET | Freq: Every day | ORAL | Status: DC
  Administered 2014-07-06: 325 mg via ORAL
  Filled 2014-07-06 (×2): qty 1

## 2014-07-06 MED ORDER — ALPRAZOLAM 0.25 MG PO TABS: 0.2500 mg | ORAL_TABLET | ORAL | Status: DC | PRN

## 2014-07-06 MED ORDER — LEVALBUTEROL HCL 0.63 MG/3ML IN NEBU: 0.6300 mg | INHALATION_SOLUTION | Freq: Four times a day (QID) | RESPIRATORY_TRACT | Status: DC | PRN

## 2014-07-06 MED ORDER — DOPAMINE-DEXTROSE 3.2-5 MG/ML-% IV SOLN
0.0000 ug/kg/min | INTRAVENOUS | Status: DC
  Filled 2014-07-06: qty 250

## 2014-07-06 MED ORDER — TEMAZEPAM 15 MG PO CAPS: 15.0000 mg | ORAL_CAPSULE | Freq: Once | ORAL | Status: AC | PRN

## 2014-07-06 MED ORDER — LEVALBUTEROL HCL 0.63 MG/3ML IN NEBU
0.6300 mg | INHALATION_SOLUTION | Freq: Three times a day (TID) | RESPIRATORY_TRACT | Status: AC
  Administered 2014-07-06 (×3): 0.63 mg via RESPIRATORY_TRACT
  Filled 2014-07-06 (×3): qty 3

## 2014-07-06 MED ORDER — SODIUM CHLORIDE 0.9 % IV SOLN
INTRAVENOUS | Status: DC
  Filled 2014-07-06: qty 30

## 2014-07-06 MED ORDER — METOPROLOL TARTRATE 12.5 MG HALF TABLET
12.5000 mg | ORAL_TABLET | Freq: Once | ORAL | Status: AC
  Administered 2014-07-07: 12.5 mg via ORAL
  Filled 2014-07-06: qty 1

## 2014-07-06 MED ORDER — POTASSIUM CHLORIDE 2 MEQ/ML IV SOLN
80.0000 meq | INTRAVENOUS | Status: DC
  Filled 2014-07-06: qty 40

## 2014-07-06 MED ORDER — BISACODYL 5 MG PO TBEC: 5.0000 mg | DELAYED_RELEASE_TABLET | Freq: Once | ORAL | Status: DC

## 2014-07-06 MED ORDER — PHENYLEPHRINE HCL 10 MG/ML IJ SOLN
30.0000 ug/min | INTRAVENOUS | Status: AC
  Administered 2014-07-07: 20 ug/min via INTRAVENOUS
  Filled 2014-07-06: qty 2

## 2014-07-06 NOTE — Progress Notes (Signed)
Pt's beard  Clipped ( w/electric razor) to bilat :  cheeks and neck areas ( for ETT  securement  and c-line  Area ) . Pre- and post CABG procedures reviewed .

## 2014-07-06 NOTE — Progress Notes (Signed)
1 Day Post-Op Procedure(s) (LRB): LEFT HEART CATHETERIZATION WITH CORONARY ANGIOGRAM (N/A) Subjective: No complaints at present  Objective: Vital signs in last 24 hours: Temp:  [97.6 F (36.4 C)-98.6 F (37 C)] 98.6 F (37 C) (11/18 1557) Pulse Rate:  [69-90] 78 (11/18 1557) Cardiac Rhythm:  [-] Normal sinus rhythm (11/18 0800) Resp:  [13-28] 16 (11/18 1557) BP: (97-129)/(57-87) 105/68 mmHg (11/18 1557) SpO2:  [92 %-98 %] 92 % (11/18 1557)  Hemodynamic parameters for last 24 hours:    Intake/Output from previous day: 11/17 0701 - 11/18 0700 In: 1532.5 [P.O.:600; I.V.:932.5] Out: 2050 [Urine:2050] Intake/Output this shift: Total I/O In: 327.5 [P.O.:180; I.V.:147.5] Out: 775 [Urine:775]  General appearance: alert and no distress Neurologic: intact Heart: regular rate and rhythm Lungs: diminished breath sounds bilaterally Abdomen: normal findings: soft, non-tender Extremities: Abnormal [name]'s test on left  Lab Results:  Recent Labs  07/05/14 0850 07/06/14 0333  WBC 8.4 7.7  HGB 15.2 14.4  HCT 45.2 44.2  PLT 186 165   BMET:  Recent Labs  07/05/14 0850 07/06/14 0333  NA 142 137  K 4.8 4.7  CL 102 101  CO2 29 22  GLUCOSE 119* 122*  BUN 10 7  CREATININE 0.62 0.61  CALCIUM 9.1 8.8    PT/INR:  Recent Labs  07/05/14 0850  LABPROT 14.4  INR 1.10   ABG    Component Value Date/Time   TCO2 28 07/05/2014 0052   CBG (last 3)   Recent Labs  07/05/14 2243 07/06/14 0720 07/06/14 1106  GLUCAP 147* 135* 123*    Assessment/Plan: S/P Procedure(s) (LRB): LEFT HEART CATHETERIZATION WITH CORONARY ANGIOGRAM (N/A) FOR CABG in AM  I reassessed his [name]'s test with better lighting today. He does not get all the blood out of the capillary bed due to weakness in the left hand. He has an abnormal test with refill only when the radial is released. He can actually feel the blood flow return when radial released. Therefore, will not use radial.  He is aware of  the risks/ benefits of CABG and agrees to proceed  For OR tomorrow   LOS: 1 day    Kyleena Scheirer C 07/06/2014

## 2014-07-06 NOTE — Progress Notes (Signed)
ANTICOAGULATION CONSULT NOTE   Pharmacy Consult for Heparin Indication: chest pain/ACS  No Known Allergies  Patient Measurements: Height: 6' (182.9 cm) Weight: 276 lb 3.8 oz (125.3 kg) IBW/kg (Calculated) : 77.6  Vital Signs: Temp: 97.6 F (36.4 C) (11/18 0000) Temp Source: Oral (11/18 0000) BP: 122/69 mmHg (11/18 0300) Pulse Rate: 74 (11/18 0300)  Labs:  Recent Labs  07/05/14 0041 07/05/14 0052 07/05/14 0850 07/05/14 1500 07/05/14 2030 07/06/14 0333  HGB 16.7 17.7* 15.2  --   --  14.4  HCT 49.2 52.0 45.2  --   --  44.2  PLT 223  --  186  --   --  165  LABPROT  --   --  14.4  --   --   --   INR  --   --  1.10  --   --   --   HEPARINUNFRC  --   --  0.35  --   --  0.18*  CREATININE 0.70 0.70 0.62  --   --   --   TROPONINI <0.30  --  18.09* 19.58* 18.08*  --     Estimated Creatinine Clearance: 146.1 mL/min (by C-G formula based on Cr of 0.62).  Assessment: 53 y.o. male with chest pain, s/p cath and awaiting possible CABG for heparin   Goal of Therapy:  Heparin level 0.3-0.7 units/ml Monitor platelets by anticoagulation protocol: Yes   Plan:  Increase Heparin 1650 units/hr Check heparin level in 6 hours.  Antonio Cordova, Antonio Cordova 07/06/2014,4:47 AM

## 2014-07-06 NOTE — Progress Notes (Signed)
[id number] Pt had [facility] booklet, gave care guide and encouraged him have wife read it also. Discussed sternal precautions and demonstrated  how to get up and down without use of arms. Discussed importance of mobility and IS after surgery. Pt has nicotine patch on now but is not sure he is going to quit. Will address this again closer to discharge. Pt stated his wife is taking care of both of their mothers so he is unsure if she can be with him after discharge. Case manager will need to address with pt as he may need rehab at discharge. We will follow up after surgery. Luetta NuttingCharlene Dyna Figuereo RN BSN 07/06/2014 2:55 PM

## 2014-07-06 NOTE — Progress Notes (Addendum)
Patient Name: Antonio Cordova Date of Encounter: 07/06/2014    SUBJECTIVE:Unfortunately but not surprisingly, the patient had severe diffuse multivessel coronary disease that would be best treated with surgery. He has met Dr. Roxan Hockey. He has no questions this morning. He has had very mild waxing and waning episodes of chest discomfort. He denies dyspnea.  TELEMETRY:  Normal sinus rhythm with marked reduction in ventricular ectopy that was noted on rounds yesterday. Filed Vitals:   07/06/14 1000 07/06/14 1100 07/06/14 1102 07/06/14 1129  BP: 113/72 119/76 119/76   Pulse: 84 87 88   Temp:   98.1 F (36.7 C)   TempSrc:   Oral   Resp: _0 Height:      Weight:      SpO2: 96% 93% 97% 94%    Intake/Output Summary (Last 24 hours) at 07/06/14 1158 Last data filed at 07/06/14 1100  Gross per 24 hour  Intake 1556.47 ml  Output   2475 ml  Net -918.53 ml   LABS: Basic Metabolic Panel:  Recent Labs  07/05/14 0850 07/06/14 0333  NA 142 137  K 4.8 4.7  CL 102 101  CO2 29 22  GLUCOSE 119* 122*  BUN 10 7  CREATININE 0.62 0.61  CALCIUM 9.1 8.8  MG 2.0  --    CBC:  Recent Labs  07/05/14 0041  07/05/14 0850 07/06/14 0333  WBC 12.6*  --  8.4 7.7  NEUTROABS 6.0  --   --   --   HGB 16.7  < > 15.2 14.4  HCT 49.2  < > 45.2 44.2  MCV 93.7  --  92.6 93.8  PLT 223  --  186 165  < > = values in this interval not displayed. Cardiac Enzymes:  Recent Labs  07/05/14 0850 07/05/14 1500 07/05/14 2030  TROPONINI 18.09* 19.58* 18.08*   BNP: Invalid input(s): POCBNP Hemoglobin A1C:  Recent Labs  07/05/14 0850  HGBA1C 7.2*   Fasting Lipid Panel:  Recent Labs  07/05/14 0850  CHOL 168  HDL 26*  LDLCALC 111*  TRIG 154*  CHOLHDL 6.5   BNP    Component Value Date/Time   PROBNP 68.5 07/05/2014 0850    Radiology/Studies:  No new data  Physical Exam: Blood pressure 119/76, pulse 88, temperature 98.1 F (36.7 C), temperature source Oral, resp.  rate 20, height 6' (1.829 m), weight 276 lb 3.8 oz (125.3 kg), SpO2 94 %. Weight change:   Wt Readings from Last 3 Encounters:  07/05/14 276 lb 3.8 oz (125.3 kg)   Marked obesity. Chest demonstrates scattered wheezes on expiration. No rales are heard. Cardiac exam reveals no murmur. An S4 gallop is audible Extremities reveal no edema. Right radial cath site is unremarkable. Neurological exam is unremarkable.  ASSESSMENT:  1. Non-ST elevation myocardial infarction with postinfarction angina, currently pain-free on IV nitroglycerin and heparin. 2. Severe multivessel diffuse coronary disease, slated for surgical revascularization on 07/07/2014 3. COPD with wheezing 4. Marked obesity  Plan:  1. IV nitroglycerin and titrate to pain control 2. Optimize beta blocker therapy 3. Continue IV heparin 4. Cautioned to communicate with his nurse if any prolonged episodes of chest discomfort 5. For surgical revascularization tomorrow, or earlier if prolonged pain unrelieved by medication manipulation. 6. We'll attempt beta-2 selective therapy of bronchospasm and hopefully avoid aggravation of anginal symptoms  Signed, Sinclair Grooms 07/06/2014, 11:58 AM

## 2014-07-06 NOTE — Progress Notes (Signed)
Pt using incentive spirometer correctly  to 1750cc mark .

## 2014-07-06 NOTE — Progress Notes (Signed)
ANTICOAGULATION CONSULT NOTE   Pharmacy Consult for Heparin Indication: chest pain/ACS  No Known Allergies  Patient Measurements: Height: 6' (182.9 cm) Weight: 276 lb 3.8 oz (125.3 kg) IBW/kg (Calculated) : 77.6  Vital Signs: Temp: 98.1 F (36.7 C) (11/18 1102) Temp Source: Oral (11/18 1102) BP: 100/60 mmHg (11/18 1300) Pulse Rate: 86 (11/18 1300)  Labs:  Recent Labs  07/05/14 0041 07/05/14 0052 07/05/14 0850 07/05/14 1500 07/05/14 2030 07/06/14 0333 07/06/14 1149  HGB 16.7 17.7* 15.2  --   --  14.4  --   HCT 49.2 52.0 45.2  --   --  44.2  --   PLT 223  --  186  --   --  165  --   LABPROT  --   --  14.4  --   --   --   --   INR  --   --  1.10  --   --   --   --   HEPARINUNFRC  --   --  0.35  --   --  0.18* 0.39  CREATININE 0.70 0.70 0.62  --   --  0.61  --   TROPONINI <0.30  --  18.09* 19.58* 18.08*  --   --     Estimated Creatinine Clearance: 146.1 mL/min (by C-G formula based on Cr of 0.61).  Assessment: 53 y.o. male with chest pain, s/p cath and awaiting possible CABG for heparin.  Coag: ACS, heparin infusing at 1650 units/hr, heparin level at goal.  CBC wnl, no bleeding noted.  Goal of Therapy:  Heparin level 0.3-0.7 units/ml Monitor platelets by anticoagulation protocol: Yes   Plan:  Continue Heparin 1650 units/hr Daily heparin level and CBC Note plan to go for surgical revascularization on 11/19  Thank you for allowing pharmacy to be a part of this patients care team.  Lovenia KimJulie Jasun Gasparini Pharm.D., BCPS, AQ-Cardiology Clinical Pharmacist 07/06/2014 1:38 PM Pager: [phone] Phone: [phone]

## 2014-07-06 NOTE — Progress Notes (Signed)
Pt and spouse watching video #112 ( pre-CABG ) .

## 2014-07-06 NOTE — Consult Note (Signed)
Reason for Consult:3 vessel CAD s/p NQWMI Referring Physician: Dr. [place]  Mercury Fricke is an 53 y.o. male.  HPI: 53 yo man presented with a cc/o chest pain  Mr. Zellars is a 53 yo man with type II DM and a history of tobacco abuse who was awakened from his sleep at ~ 11PM on 11/16 with substernal chest pain. He described this a a sharp pain like being stabbed. He was also short of breath. He called EMS and was brought to the ED.  His initial ECG showed demonstrated diffuse ST depressions in inferior leads and V3-V6. There was an old ASMI. He was started on IV heparin and IV NTG and was given morphine and zofran. His ST depression improved. His initial troponin was 0.01, but he subsequently ruled in for an MI with a troponin of 19. His CXR showed mild pulm edema.   He denies any previous history of CAD or chest pain. He does have DOE which occurs after walking approximately 50 yards on level ground. He can just get up a flight of 10 stairs without needing to take a rest. No family history of heart disease among parents or siblings. He smokes 1.5 PPD. He says he does frequently have wheezing.  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 EF was 45 % with anterior hypokinesis. An echo showed no significant valvular pathology.   Past Medical History  Diagnosis Date  . Diabetes mellitus without complication   . Hyperlipidemia     History reviewed. No pertinent past surgical history.  History reviewed. No pertinent family history.- No family history of CAD, + DM M and MGM  Social History:  reports that he has been smoking.  He has never used smokeless tobacco. He reports that he drinks alcohol. His drug history is not on file.  Allergies: No Known Allergies  Medications:  Prior to Admission:  Prescriptions prior to admission  Medication Sig  Dispense Refill Last Dose  . Acetaminophen (TYLENOL PO) Take 2 tablets by mouth daily as needed (pain).   Past Week at Unknown time    Results for orders placed or performed during the hospital encounter of 07/05/14 (from the past 48 hour(s))  CBC with Differential     Status: Abnormal   Collection Time: 07/05/14 12:41 AM  Result Value Ref Range   WBC 12.6 (H) 4.0 - 10.5 K/uL   RBC 5.25 4.22 - 5.81 MIL/uL   Hemoglobin 16.7 13.0 - 17.0 g/dL   HCT 49.2 39.0 - 52.0 %   MCV 93.7 78.0 - 100.0 fL   MCH 31.8 26.0 - 34.0 pg   MCHC 33.9 30.0 - 36.0 g/dL   RDW 12.6 11.5 - 15.5 %   Platelets 223 150 - 400 K/uL   Neutrophils Relative % 47 43 - 77 %   Neutro Abs 6.0 1.7 - 7.7 K/uL   Lymphocytes Relative 43 12 - 46 %   Lymphs Abs 5.4 (H) 0.7 - 4.0 K/uL   Monocytes Relative 7 3 - 12 %   Monocytes Absolute 0.9 0.1 - 1.0 K/uL   Eosinophils Relative 2 0 - 5 %   Eosinophils Absolute 0.2 0.0 - 0.7 K/uL   Basophils Relative 1 0 - 1 %   Basophils Absolute 0.1 0.0 - 0.1 K/uL  Basic metabolic panel     Status:  Abnormal   Collection Time: 07/05/14 12:41 AM  Result Value Ref Range   Sodium 141 137 - 147 mEq/L   Potassium 4.1 3.7 - 5.3 mEq/L   Chloride 101 96 - 112 mEq/L   CO2 26 19 - 32 mEq/L   Glucose, Bld 162 (H) 70 - 99 mg/dL   BUN 12 6 - 23 mg/dL   Creatinine, Ser 0.70 0.50 - 1.35 mg/dL   Calcium 9.4 8.4 - 10.5 mg/dL   GFR calc non Af Amer >90 >90 mL/min   GFR calc Af Amer >90 >90 mL/min    Comment: (NOTE) The eGFR has been calculated using the CKD EPI equation. This calculation has not been validated in all clinical situations. eGFR's persistently <90 mL/min signify possible Chronic Kidney Disease.    Anion gap 14 5 - 15  Pro b natriuretic peptide (BNP)     Status: None   Collection Time: 07/05/14 12:41 AM  Result Value Ref Range   Pro B Natriuretic peptide (BNP) 10.7 0 - 125 pg/mL  Troponin I     Status: None   Collection Time: 07/05/14 12:41 AM  Result Value Ref Range   Troponin I  <0.30 <0.30 ng/mL    Comment:        Due to the release kinetics of cTnI, a negative result within the first hours of the onset of symptoms does not rule out myocardial infarction with certainty. If myocardial infarction is still suspected, repeat the test at appropriate intervals.   I-Stat Troponin, ED - 0, 3, 6 hours (not at [facility])     Status: None   Collection Time: 07/05/14 12:49 AM  Result Value Ref Range   Troponin i, poc 0.01 0.00 - 0.08 ng/mL   Comment 3            Comment: Due to the release kinetics of cTnI, a negative result within the first hours of the onset of symptoms does not rule out myocardial infarction with certainty. If myocardial infarction is still suspected, repeat the test at appropriate intervals.   I-Stat Chem 8, ED     Status: Abnormal   Collection Time: 07/05/14 12:52 AM  Result Value Ref Range   Sodium 141 137 - 147 mEq/L   Potassium 3.9 3.7 - 5.3 mEq/L   Chloride 101 96 - 112 mEq/L   BUN 12 6 - 23 mg/dL   Creatinine, Ser 0.70 0.50 - 1.35 mg/dL   Glucose, Bld 165 (H) 70 - 99 mg/dL   Calcium, Ion 1.15 1.12 - 1.23 mmol/L   TCO2 28 0 - 100 mmol/L   Hemoglobin 17.7 (H) 13.0 - 17.0 g/dL   HCT 52.0 39.0 - 52.0 %  MRSA PCR Screening     Status: None   Collection Time: 07/05/14  3:04 AM  Result Value Ref Range   MRSA by PCR NEGATIVE NEGATIVE    Comment:        The GeneXpert MRSA Assay (FDA approved for NASAL specimens only), is one component of a comprehensive MRSA colonization surveillance program. It is not intended to diagnose MRSA infection nor to guide or monitor treatment for MRSA infections.   Glucose, capillary     Status: Abnormal   Collection Time: 07/05/14  5:10 AM  Result Value Ref Range   Glucose-Capillary 173 (H) 70 - 99 mg/dL   Comment 1 Capillary Sample   Glucose, capillary     Status: Abnormal   Collection Time: 07/05/14  7:15 AM  Result Value Ref  Range   Glucose-Capillary 145 (H) 70 - 99 mg/dL   Comment 1 Capillary Sample    Basic metabolic panel     Status: Abnormal   Collection Time: 07/05/14  8:50 AM  Result Value Ref Range   Sodium 142 137 - 147 mEq/L   Potassium 4.8 3.7 - 5.3 mEq/L   Chloride 102 96 - 112 mEq/L   CO2 29 19 - 32 mEq/L   Glucose, Bld 119 (H) 70 - 99 mg/dL   BUN 10 6 - 23 mg/dL   Creatinine, Ser 0.62 0.50 - 1.35 mg/dL   Calcium 9.1 8.4 - 10.5 mg/dL   GFR calc non Af Amer >90 >90 mL/min   GFR calc Af Amer >90 >90 mL/min    Comment: (NOTE) The eGFR has been calculated using the CKD EPI equation. This calculation has not been validated in all clinical situations. eGFR's persistently <90 mL/min signify possible Chronic Kidney Disease.    Anion gap 11 5 - 15  CBC     Status: None   Collection Time: 07/05/14  8:50 AM  Result Value Ref Range   WBC 8.4 4.0 - 10.5 K/uL   RBC 4.88 4.22 - 5.81 MIL/uL   Hemoglobin 15.2 13.0 - 17.0 g/dL   HCT 45.2 39.0 - 52.0 %   MCV 92.6 78.0 - 100.0 fL   MCH 31.1 26.0 - 34.0 pg   MCHC 33.6 30.0 - 36.0 g/dL   RDW 12.8 11.5 - 15.5 %   Platelets 186 150 - 400 K/uL  Protime-INR     Status: None   Collection Time: 07/05/14  8:50 AM  Result Value Ref Range   Prothrombin Time 14.4 11.6 - 15.2 seconds   INR 1.10 0.00 - 1.49  Hemoglobin A1c     Status: Abnormal   Collection Time: 07/05/14  8:50 AM  Result Value Ref Range   Hgb A1c MFr Bld 7.2 (H) <5.7 %    Comment: (NOTE)                                                                       According to the ADA Clinical Practice Recommendations for 2011, when HbA1c is used as a screening test:  >=6.5%   Diagnostic of Diabetes Mellitus           (if abnormal result is confirmed) 5.7-6.4%   Increased risk of developing Diabetes Mellitus References:Diagnosis and Classification of Diabetes Mellitus,Diabetes AYTK,1601,09(NATFT 1):S62-S69 and Standards of Medical Care in         Diabetes - 2011,Diabetes Care,2011,34 (Suppl 1):S11-S61.    Mean Plasma Glucose 160 (H) <117 mg/dL    Comment: Performed at  [facility]  Troponin I-(serum)     Status: Abnormal   Collection Time: 07/05/14  8:50 AM  Result Value Ref Range   Troponin I 18.09 (HH) <0.30 ng/mL    Comment:        Due to the release kinetics of cTnI, a negative result within the first hours of the onset of symptoms does not rule out myocardial infarction with certainty. If myocardial infarction is still suspected, repeat the test at appropriate intervals. CRITICAL RESULT CALLED TO, READ BACK BY AND VERIFIED WITH: PAM ALVORD,RN AT [id number] 07/05/14 BY  ZBEECH.   TSH     Status: None   Collection Time: 07/05/14  8:50 AM  Result Value Ref Range   TSH 0.720 0.350 - 4.500 uIU/mL  Magnesium     Status: None   Collection Time: 07/05/14  8:50 AM  Result Value Ref Range   Magnesium 2.0 1.5 - 2.5 mg/dL  Type and screen     Status: None   Collection Time: 07/05/14  8:50 AM  Result Value Ref Range   ABO/RH(D) A POS    Antibody Screen NEG    Sample Expiration 07/08/2014   Lipid panel     Status: Abnormal   Collection Time: 07/05/14  8:50 AM  Result Value Ref Range   Cholesterol 168 0 - 200 mg/dL   Triglycerides 154 (H) <150 mg/dL   HDL 26 (L) >39 mg/dL   Total CHOL/HDL Ratio 6.5 RATIO   VLDL 31 0 - 40 mg/dL   LDL Cholesterol 111 (H) 0 - 99 mg/dL    Comment:        Total Cholesterol/HDL:CHD Risk Coronary Heart Disease Risk Table                     Men   Women  1/2 Average Risk   3.4   3.3  Average Risk       5.0   4.4  2 X Average Risk   9.6   7.1  3 X Average Risk  23.4   11.0        Use the calculated Patient Ratio above and the CHD Risk Table to determine the patient's CHD Risk.        ATP III CLASSIFICATION (LDL):  <100     mg/dL   Optimal  100-129  mg/dL   Near or Above                    Optimal  130-159  mg/dL   Borderline  160-189  mg/dL   High  >190     mg/dL   Very High   Pro b natriuretic peptide (BNP)     Status: None   Collection Time: 07/05/14  8:50 AM  Result Value Ref Range   Pro B  Natriuretic peptide (BNP) 68.5 0 - 125 pg/mL  Heparin level (unfractionated)     Status: None   Collection Time: 07/05/14  8:50 AM  Result Value Ref Range   Heparin Unfractionated 0.35 0.30 - 0.70 IU/mL    Comment:        IF HEPARIN RESULTS ARE BELOW EXPECTED VALUES, AND PATIENT DOSAGE HAS BEEN CONFIRMED, SUGGEST FOLLOW UP TESTING OF ANTITHROMBIN III LEVELS.   ABO/Rh     Status: None   Collection Time: 07/05/14  8:50 AM  Result Value Ref Range   ABO/RH(D) A POS   Glucose, capillary     Status: Abnormal   Collection Time: 07/05/14 11:04 AM  Result Value Ref Range   Glucose-Capillary 113 (H) 70 - 99 mg/dL   Comment 1 Capillary Sample   Troponin I-(serum)     Status: Abnormal   Collection Time: 07/05/14  3:00 PM  Result Value Ref Range   Troponin I 19.58 (HH) <0.30 ng/mL    Comment:        Due to the release kinetics of cTnI, a negative result within the first hours of the onset of symptoms does not rule out myocardial infarction with certainty. If myocardial infarction is still suspected, repeat the  test at appropriate intervals. CRITICAL VALUE NOTED.  VALUE IS CONSISTENT WITH PREVIOUSLY REPORTED AND CALLED VALUE.   Glucose, capillary     Status: Abnormal   Collection Time: 07/05/14  4:10 PM  Result Value Ref Range   Glucose-Capillary 129 (H) 70 - 99 mg/dL   Comment 1 Capillary Sample   Glucose, capillary     Status: Abnormal   Collection Time: 07/05/14  5:27 PM  Result Value Ref Range   Glucose-Capillary 123 (H) 70 - 99 mg/dL   Comment 1 Capillary Sample   Troponin I-(serum)     Status: Abnormal   Collection Time: 07/05/14  8:30 PM  Result Value Ref Range   Troponin I 18.08 (HH) <0.30 ng/mL    Comment:        Due to the release kinetics of cTnI, a negative result within the first hours of the onset of symptoms does not rule out myocardial infarction with certainty. If myocardial infarction is still suspected, repeat the test at appropriate intervals. CRITICAL  VALUE NOTED.  VALUE IS CONSISTENT WITH PREVIOUSLY REPORTED AND CALLED VALUE.   Glucose, capillary     Status: Abnormal   Collection Time: 07/05/14 10:43 PM  Result Value Ref Range   Glucose-Capillary 147 (H) 70 - 99 mg/dL   Comment 1 Capillary Sample   CBC     Status: None   Collection Time: 07/06/14  3:33 AM  Result Value Ref Range   WBC 7.7 4.0 - 10.5 K/uL   RBC 4.71 4.22 - 5.81 MIL/uL   Hemoglobin 14.4 13.0 - 17.0 g/dL   HCT 44.2 39.0 - 52.0 %   MCV 93.8 78.0 - 100.0 fL   MCH 30.6 26.0 - 34.0 pg   MCHC 32.6 30.0 - 36.0 g/dL   RDW 12.9 11.5 - 15.5 %   Platelets 165 150 - 400 K/uL  Heparin level (unfractionated)     Status: Abnormal   Collection Time: 07/06/14  3:33 AM  Result Value Ref Range   Heparin Unfractionated 0.18 (L) 0.30 - 0.70 IU/mL    Comment:        IF HEPARIN RESULTS ARE BELOW EXPECTED VALUES, AND PATIENT DOSAGE HAS BEEN CONFIRMED, SUGGEST FOLLOW UP TESTING OF ANTITHROMBIN III LEVELS.   Comprehensive metabolic panel     Status: Abnormal   Collection Time: 07/06/14  3:33 AM  Result Value Ref Range   Sodium 137 137 - 147 mEq/L   Potassium 4.7 3.7 - 5.3 mEq/L   Chloride 101 96 - 112 mEq/L   CO2 22 19 - 32 mEq/L   Glucose, Bld 122 (H) 70 - 99 mg/dL   BUN 7 6 - 23 mg/dL   Creatinine, Ser 0.61 0.50 - 1.35 mg/dL   Calcium 8.8 8.4 - 10.5 mg/dL   Total Protein 6.1 6.0 - 8.3 g/dL   Albumin 3.5 3.5 - 5.2 g/dL   AST 87 (H) 0 - 37 U/L   ALT 36 0 - 53 U/L   Alkaline Phosphatase 59 39 - 117 U/L   Total Bilirubin 0.5 0.3 - 1.2 mg/dL   GFR calc non Af Amer >90 >90 mL/min   GFR calc Af Amer >90 >90 mL/min    Comment: (NOTE) The eGFR has been calculated using the CKD EPI equation. This calculation has not been validated in all clinical situations. eGFR's persistently <90 mL/min signify possible Chronic Kidney Disease.    Anion gap 14 5 - 15  Glucose, capillary     Status: Abnormal  Collection Time: 07/06/14  7:20 AM  Result Value Ref Range   Glucose-Capillary  135 (H) 70 - 99 mg/dL   Comment 1 Capillary Sample     Dg Chest Port 1 View  07/05/2014   CLINICAL DATA:  Chest pain and shortness of breath  EXAM: PORTABLE CHEST - 1 VIEW  COMPARISON:  None.  FINDINGS: Cardiac shadow is within normal limits. Increased vascular congestion is noted without significant pulmonary edema. No focal infiltrate is seen. No bony abnormality is noted.  IMPRESSION: Mild vascular congestion.   Electronically Signed   By: Inez Catalina M.D.   On: 07/05/2014 01:00    Review of Systems  Constitutional: Positive for malaise/fatigue. Negative for fever and chills.  Respiratory: Positive for cough and shortness of breath.   Cardiovascular: Positive for chest pain. Negative for orthopnea, claudication, leg swelling and PND.  All other systems reviewed and are negative.  Blood pressure 97/69, pulse 83, temperature 97.9 F (36.6 C), temperature source Oral, resp. rate 16, height 6' (1.829 m), weight 276 lb 3.8 oz (125.3 kg), SpO2 97 %. Physical Exam  Vitals reviewed. Constitutional: He is oriented to person, place, and time. No distress.  obese  HENT:  Head: Normocephalic and atraumatic.  Eyes: EOM are normal. Pupils are equal, round, and reactive to light.  Neck: Neck supple. No thyromegaly present.  Cardiovascular: Normal rate, regular rhythm and intact distal pulses.   Murmur (2/6 systolic) heard. Normal [name]'s test on left  Respiratory: Effort normal. He has wheezes (bilateral).  GI: Soft. There is no tenderness.  Musculoskeletal: He exhibits no edema.  Lymphadenopathy:    He has no cervical adenopathy.  Neurological: He is alert and oriented to person, place, and time.  Skin: Skin is warm and dry.   CARDIAC CATHETERIZATION Procedural Findings: Hemodynamics: AO 93/64 mean 77 mm Hg LV 95/20 mm Hg  Coronary angiography: Coronary dominance: right  Left mainstem: The left main is calcified with 30-40% distal disease.   Left anterior descending (LAD): The  LAD is heavily calcified with severe diffuse proximal disease up to 90%. There is a 90% stenosis at the takeoff of the second diagonal.   Ramus intermediate: this is a large branch with diffuse 80% stenosis in the proximal vessel and 70% stenosis in the mid vessel.   Left circumflex (LCx): 100% occluded at the ostium. There are left to left collaterals to an OM branch and right to left collaterals to the distal LCx  Right coronary artery (RCA): The RCA is heavily calcified. There is an 80% proximal stenosis followed by 70% disease in the mid vessel.   Left ventriculography: Left ventricular systolic function is abnormal, there is mid anterior HK. LVEF is estimated at 45%, there is no significant mitral regurgitation   Final Conclusions:  1. Severe 3 vessel obstructive CAD  2. Mild to moderate LV dysfunction.  Assessment/Plan: 53 YO man with DM and tobacco abuse presents with a NQWMI. He has severe 3 vessel CAD with moderate LV dysfunction. CABG is indicated for survival benefit and relief of symptoms.  I discussed the general nature of the procedure, the need for general anesthesia, the use of cardiopulmonary bypass, and the incisions to be used with Mr and Mrs Matthews. We also discussed the expected hospital stay, overall recovery and short and long term outcomes. I reviewed the indications, risks, benefits and alternatives. They understand the risks include, but are not limited to death, stroke, MI, DVT/PE, bleeding, possible need for transfusion, infections, and other  organ system dysfunction including respiratory, renal, or GI complications. He accepts the risks and agrees to proceed.  He is not a candidate for bilateral IMA due to DM but has a normal [name]'s test on the left. He has had surgery on the left arm, but will consider use of the left radial artery in addition to LIMA and saphenous vein.  For CABG Thursday 11/19  Danial Sisley C 07/06/2014, 8:22 AM

## 2014-07-07 ENCOUNTER — Inpatient Hospital Stay (HOSPITAL_COMMUNITY): Payer: BC Managed Care – PPO

## 2014-07-07 ENCOUNTER — Encounter (HOSPITAL_COMMUNITY)
Admission: EM | Disposition: A | Discharge status: Home / Self Care | Payer: BC Managed Care – PPO | Source: Home / Self Care | Attending: Thoracic Surgery (Cardiothoracic Vascular Surgery)

## 2014-07-07 ENCOUNTER — Inpatient Hospital Stay (HOSPITAL_COMMUNITY): Payer: BC Managed Care – PPO | Admitting: Anesthesiology

## 2014-07-07 ENCOUNTER — Encounter (HOSPITAL_COMMUNITY): Payer: Self-pay | Admitting: Anesthesiology

## 2014-07-07 DIAGNOSIS — Z951 Presence of aortocoronary bypass graft: Secondary | ICD-10-CM

## 2014-07-07 DIAGNOSIS — I2510 Atherosclerotic heart disease of native coronary artery without angina pectoris: Secondary | ICD-10-CM

## 2014-07-07 HISTORY — PX: CORONARY ARTERY BYPASS GRAFT: SHX141

## 2014-07-07 HISTORY — PX: RADIAL ARTERY HARVEST: SHX5067

## 2014-07-07 HISTORY — PX: INTRAOPERATIVE TRANSESOPHAGEAL ECHOCARDIOGRAM: SHX5062

## 2014-07-07 LAB — BLOOD GAS, ARTERIAL
Acid-Base Excess: 4 mmol/L — ABNORMAL HIGH (ref 0.0–2.0)
BICARBONATE: 28.6 meq/L — AB (ref 20.0–24.0)
Drawn by: 41308
O2 Content: 2 L/min
O2 SAT: 93.8 %
PO2 ART: 72.9 mmHg — AB (ref 80.0–100.0)
Patient temperature: 98.6
TCO2: 30.1 mmol/L (ref 0–100)
pCO2 arterial: 48.1 mmHg — ABNORMAL HIGH (ref 35.0–45.0)
pH, Arterial: 7.393 (ref 7.350–7.450)

## 2014-07-07 LAB — GLUCOSE, CAPILLARY: Glucose-Capillary: 123 mg/dL — ABNORMAL HIGH (ref 70–99)

## 2014-07-07 LAB — CBC
HCT: 41.2 % (ref 39.0–52.0)
HCT: 39.4 % (ref 39.0–52.0)
HEMATOCRIT: 42.1 % (ref 39.0–52.0)
Hemoglobin: 14.1 g/dL (ref 13.0–17.0)
Hemoglobin: 14 g/dL (ref 13.0–17.0)
Hemoglobin: 13.5 g/dL (ref 13.0–17.0)
MCH: 30.7 pg (ref 26.0–34.0)
MCH: 30.9 pg (ref 26.0–34.0)
MCH: 31 pg (ref 26.0–34.0)
MCHC: 33.5 g/dL (ref 30.0–36.0)
MCHC: 34 g/dL (ref 30.0–36.0)
MCHC: 34.3 g/dL (ref 30.0–36.0)
MCV: 91.7 fL (ref 78.0–100.0)
MCV: 90.9 fL (ref 78.0–100.0)
MCV: 90.4 fL (ref 78.0–100.0)
PLATELETS: 122 10*3/uL — AB (ref 150–400)
PLATELETS: 133 10*3/uL — AB (ref 150–400)
Platelets: 178 10*3/uL (ref 150–400)
RBC: 4.59 MIL/uL (ref 4.22–5.81)
RBC: 4.53 MIL/uL (ref 4.22–5.81)
RBC: 4.36 MIL/uL (ref 4.22–5.81)
RDW: 12.6 % (ref 11.5–15.5)
RDW: 12.5 % (ref 11.5–15.5)
RDW: 12.5 % (ref 11.5–15.5)
WBC: 7.1 10*3/uL (ref 4.0–10.5)
WBC: 15.9 10*3/uL — AB (ref 4.0–10.5)
WBC: 13.5 10*3/uL — ABNORMAL HIGH (ref 4.0–10.5)

## 2014-07-07 LAB — POCT I-STAT 3, ART BLOOD GAS (G3+)
Acid-Base Excess: 1 mmol/L (ref 0.0–2.0)
Bicarbonate: 27.9 mEq/L — ABNORMAL HIGH (ref 20.0–24.0)
Bicarbonate: 26.9 mEq/L — ABNORMAL HIGH (ref 20.0–24.0)
Bicarbonate: 25.8 mEq/L — ABNORMAL HIGH (ref 20.0–24.0)
Bicarbonate: 27 mEq/L — ABNORMAL HIGH (ref 20.0–24.0)
Bicarbonate: 26.4 mEq/L — ABNORMAL HIGH (ref 20.0–24.0)
O2 SAT: 99 %
O2 SAT: 90 %
O2 SAT: 91 %
O2 Saturation: 91 %
O2 Saturation: 91 %
PCO2 ART: 52.5 mmHg — AB (ref 35.0–45.0)
PH ART: 7.333 — AB (ref 7.350–7.450)
PH ART: 7.348 — AB (ref 7.350–7.450)
PO2 ART: 175 mmHg — AB (ref 80.0–100.0)
PO2 ART: 65 mmHg — AB (ref 80.0–100.0)
Patient temperature: 37.7
TCO2: 29 mmol/L (ref 0–100)
TCO2: 29 mmol/L (ref 0–100)
TCO2: 27 mmol/L (ref 0–100)
TCO2: 29 mmol/L (ref 0–100)
TCO2: 28 mmol/L (ref 0–100)
pCO2 arterial: 53.9 mmHg — ABNORMAL HIGH (ref 35.0–45.0)
pCO2 arterial: 46.6 mmHg — ABNORMAL HIGH (ref 35.0–45.0)
pCO2 arterial: 54.9 mmHg — ABNORMAL HIGH (ref 35.0–45.0)
pCO2 arterial: 48.5 mmHg — ABNORMAL HIGH (ref 35.0–45.0)
pH, Arterial: 7.306 — ABNORMAL LOW (ref 7.350–7.450)
pH, Arterial: 7.354 (ref 7.350–7.450)
pH, Arterial: 7.305 — ABNORMAL LOW (ref 7.350–7.450)
pO2, Arterial: 69 mmHg — ABNORMAL LOW (ref 80.0–100.0)
pO2, Arterial: 72 mmHg — ABNORMAL LOW (ref 80.0–100.0)
pO2, Arterial: 69 mmHg — ABNORMAL LOW (ref 80.0–100.0)

## 2014-07-07 LAB — POCT I-STAT, CHEM 8
BUN: 5 mg/dL — ABNORMAL LOW (ref 6–23)
BUN: 5 mg/dL — AB (ref 6–23)
BUN: 5 mg/dL — ABNORMAL LOW (ref 6–23)
BUN: 6 mg/dL (ref 6–23)
BUN: 3 mg/dL — ABNORMAL LOW (ref 6–23)
BUN: 3 mg/dL — ABNORMAL LOW (ref 6–23)
CALCIUM ION: 1.15 mmol/L (ref 1.12–1.23)
CALCIUM ION: 1.08 mmol/L — AB (ref 1.12–1.23)
CHLORIDE: 102 meq/L (ref 96–112)
CREATININE: 0.6 mg/dL (ref 0.50–1.35)
Calcium, Ion: 1.18 mmol/L (ref 1.12–1.23)
Calcium, Ion: 1.1 mmol/L — ABNORMAL LOW (ref 1.12–1.23)
Calcium, Ion: 1.21 mmol/L (ref 1.12–1.23)
Calcium, Ion: 1.21 mmol/L (ref 1.12–1.23)
Chloride: 100 mEq/L (ref 96–112)
Chloride: 97 mEq/L (ref 96–112)
Chloride: 99 mEq/L (ref 96–112)
Chloride: 88 mEq/L — ABNORMAL LOW (ref 96–112)
Chloride: 101 mEq/L (ref 96–112)
Creatinine, Ser: 0.5 mg/dL (ref 0.50–1.35)
Creatinine, Ser: 0.5 mg/dL (ref 0.50–1.35)
Creatinine, Ser: 0.6 mg/dL (ref 0.50–1.35)
Creatinine, Ser: 0.6 mg/dL (ref 0.50–1.35)
Creatinine, Ser: 0.6 mg/dL (ref 0.50–1.35)
GLUCOSE: 125 mg/dL — AB (ref 70–99)
Glucose, Bld: 127 mg/dL — ABNORMAL HIGH (ref 70–99)
Glucose, Bld: 133 mg/dL — ABNORMAL HIGH (ref 70–99)
Glucose, Bld: 119 mg/dL — ABNORMAL HIGH (ref 70–99)
Glucose, Bld: 157 mg/dL — ABNORMAL HIGH (ref 70–99)
Glucose, Bld: 164 mg/dL — ABNORMAL HIGH (ref 70–99)
HCT: 40 % (ref 39.0–52.0)
HCT: 40 % (ref 39.0–52.0)
HCT: 35 % — ABNORMAL LOW (ref 39.0–52.0)
HCT: 42 % (ref 39.0–52.0)
HEMATOCRIT: 33 % — AB (ref 39.0–52.0)
HEMATOCRIT: 32 % — AB (ref 39.0–52.0)
HEMOGLOBIN: 13.6 g/dL (ref 13.0–17.0)
HEMOGLOBIN: 14.3 g/dL (ref 13.0–17.0)
Hemoglobin: 13.6 g/dL (ref 13.0–17.0)
Hemoglobin: 11.2 g/dL — ABNORMAL LOW (ref 13.0–17.0)
Hemoglobin: 10.9 g/dL — ABNORMAL LOW (ref 13.0–17.0)
Hemoglobin: 11.9 g/dL — ABNORMAL LOW (ref 13.0–17.0)
POTASSIUM: 3.7 meq/L (ref 3.7–5.3)
POTASSIUM: 3.8 meq/L (ref 3.7–5.3)
POTASSIUM: 5.2 meq/L (ref 3.7–5.3)
POTASSIUM: 4.5 meq/L (ref 3.7–5.3)
Potassium: 4 mEq/L (ref 3.7–5.3)
Potassium: 4.5 mEq/L (ref 3.7–5.3)
SODIUM: 137 meq/L (ref 137–147)
SODIUM: 133 meq/L — AB (ref 137–147)
Sodium: 138 mEq/L (ref 137–147)
Sodium: 135 mEq/L — ABNORMAL LOW (ref 137–147)
Sodium: 136 mEq/L — ABNORMAL LOW (ref 137–147)
Sodium: 139 mEq/L (ref 137–147)
TCO2: 29 mmol/L (ref 0–100)
TCO2: 28 mmol/L (ref 0–100)
TCO2: 26 mmol/L (ref 0–100)
TCO2: 26 mmol/L (ref 0–100)
TCO2: 24 mmol/L (ref 0–100)
TCO2: 24 mmol/L (ref 0–100)

## 2014-07-07 LAB — POCT I-STAT 4, (NA,K, GLUC, HGB,HCT)
Glucose, Bld: 139 mg/dL — ABNORMAL HIGH (ref 70–99)
HEMATOCRIT: 42 % (ref 39.0–52.0)
Hemoglobin: 14.3 g/dL (ref 13.0–17.0)
POTASSIUM: 4.3 meq/L (ref 3.7–5.3)
SODIUM: 138 meq/L (ref 137–147)

## 2014-07-07 LAB — SURGICAL PCR SCREEN
MRSA, PCR: NEGATIVE
STAPHYLOCOCCUS AUREUS: NEGATIVE

## 2014-07-07 LAB — URINALYSIS, ROUTINE W REFLEX MICROSCOPIC
BILIRUBIN URINE: NEGATIVE
Glucose, UA: NEGATIVE mg/dL
HGB URINE DIPSTICK: NEGATIVE
KETONES UR: NEGATIVE mg/dL
Leukocytes, UA: NEGATIVE
Nitrite: NEGATIVE
Protein, ur: NEGATIVE mg/dL
SPECIFIC GRAVITY, URINE: 1.017 (ref 1.005–1.030)
UROBILINOGEN UA: 1 mg/dL (ref 0.0–1.0)
pH: 6.5 (ref 5.0–8.0)

## 2014-07-07 LAB — COMPREHENSIVE METABOLIC PANEL
ALT: 29 U/L (ref 0–53)
ANION GAP: 14 (ref 5–15)
AST: 42 U/L — ABNORMAL HIGH (ref 0–37)
Albumin: 3.5 g/dL (ref 3.5–5.2)
Alkaline Phosphatase: 60 U/L (ref 39–117)
BUN: 9 mg/dL (ref 6–23)
CO2: 26 mEq/L (ref 19–32)
CREATININE: 0.61 mg/dL (ref 0.50–1.35)
Calcium: 8.7 mg/dL (ref 8.4–10.5)
Chloride: 100 mEq/L (ref 96–112)
GFR calc non Af Amer: >90 mL/min (ref >90)
GLUCOSE: 136 mg/dL — AB (ref 70–99)
Potassium: 3.9 mEq/L (ref 3.7–5.3)
Sodium: 140 mEq/L (ref 137–147)
Total Bilirubin: 0.4 mg/dL (ref 0.3–1.2)
Total Protein: 6.2 g/dL (ref 6.0–8.3)

## 2014-07-07 LAB — PROTIME-INR
INR: 1.07 (ref 0.00–1.49)
INR: 1.26 (ref 0.00–1.49)
PROTHROMBIN TIME: 14 s (ref 11.6–15.2)
Prothrombin Time: 15.9 seconds — ABNORMAL HIGH (ref 11.6–15.2)

## 2014-07-07 LAB — HEMOGLOBIN AND HEMATOCRIT, BLOOD
HCT: 32.4 % — ABNORMAL LOW (ref 39.0–52.0)
HEMOGLOBIN: 11.2 g/dL — AB (ref 13.0–17.0)

## 2014-07-07 LAB — HEPARIN LEVEL (UNFRACTIONATED): HEPARIN UNFRACTIONATED: 0.83 [IU]/mL — AB (ref 0.30–0.70)

## 2014-07-07 LAB — CREATININE, SERUM
CREATININE: 0.55 mg/dL (ref 0.50–1.35)
GFR calc Af Amer: >90 mL/min (ref >90)
GFR calc non Af Amer: >90 mL/min (ref >90)

## 2014-07-07 LAB — MAGNESIUM: Magnesium: 2.3 mg/dL (ref 1.5–2.5)

## 2014-07-07 LAB — HEPATITIS PANEL, ACUTE
HCV Ab: NEGATIVE
Hep A IgM: NONREACTIVE
Hep B C IgM: NONREACTIVE
Hepatitis B Surface Ag: NEGATIVE

## 2014-07-07 LAB — POCT I-STAT GLUCOSE
GLUCOSE: 128 mg/dL — AB (ref 70–99)
Operator id: 3406

## 2014-07-07 LAB — APTT
aPTT: 198 seconds — ABNORMAL HIGH (ref 24–37)
aPTT: 35 seconds (ref 24–37)

## 2014-07-07 LAB — PLATELET COUNT: Platelets: 130 10*3/uL — ABNORMAL LOW (ref 150–400)

## 2014-07-07 SURGERY — CORONARY ARTERY BYPASS GRAFTING (CABG)
Anesthesia: General | Site: Chest

## 2014-07-07 MED ORDER — MORPHINE SULFATE 2 MG/ML IJ SOLN
2.0000 mg | INTRAMUSCULAR | Status: DC | PRN
  Administered 2014-07-07 – 2014-07-09 (×8): 2 mg via INTRAVENOUS
  Filled 2014-07-07 (×9): qty 1

## 2014-07-07 MED ORDER — DOCUSATE SODIUM 100 MG PO CAPS
200.0000 mg | ORAL_CAPSULE | Freq: Every day | ORAL | Status: DC
  Administered 2014-07-08 – 2014-07-13 (×5): 200 mg via ORAL
  Filled 2014-07-07 (×7): qty 2

## 2014-07-07 MED ORDER — METOPROLOL TARTRATE 1 MG/ML IV SOLN: 2.5000 mg | INTRAVENOUS | Status: DC | PRN

## 2014-07-07 MED ORDER — DOPAMINE-DEXTROSE 3.2-5 MG/ML-% IV SOLN
0.0000 ug/kg/min | INTRAVENOUS | Status: DC
  Administered 2014-07-07: 3 ug/kg/min via INTRAVENOUS

## 2014-07-07 MED ORDER — PROPOFOL 10 MG/ML IV BOLUS
INTRAVENOUS | Status: DC | PRN
  Administered 2014-07-07: 50 mg via INTRAVENOUS

## 2014-07-07 MED ORDER — HEPARIN SODIUM (PORCINE) 1000 UNIT/ML IJ SOLN
INTRAMUSCULAR | Status: AC
  Filled 2014-07-07: qty 1

## 2014-07-07 MED ORDER — PROTAMINE SULFATE 10 MG/ML IV SOLN
INTRAVENOUS | Status: AC
  Filled 2014-07-07: qty 25

## 2014-07-07 MED ORDER — METOPROLOL TARTRATE 12.5 MG HALF TABLET
12.5000 mg | ORAL_TABLET | Freq: Two times a day (BID) | ORAL | Status: DC
  Administered 2014-07-08 – 2014-07-09 (×3): 12.5 mg via ORAL
  Filled 2014-07-07 (×5): qty 1

## 2014-07-07 MED ORDER — INSULIN REGULAR BOLUS VIA INFUSION
0.0000 [IU] | Freq: Three times a day (TID) | INTRAVENOUS | Status: DC
  Administered 2014-07-08: 2 [IU] via INTRAVENOUS
  Filled 2014-07-07: qty 10

## 2014-07-07 MED ORDER — ACETAMINOPHEN 160 MG/5ML PO SOLN: 650.0000 mg | Freq: Once | ORAL | Status: AC

## 2014-07-07 MED ORDER — BISACODYL 10 MG RE SUPP: 10.0000 mg | Freq: Every day | RECTAL | Status: DC

## 2014-07-07 MED ORDER — MIDAZOLAM HCL 5 MG/5ML IJ SOLN
INTRAMUSCULAR | Status: DC | PRN
  Administered 2014-07-07: 4 mg via INTRAVENOUS
  Administered 2014-07-07: 1 mg via INTRAVENOUS
  Administered 2014-07-07: 5 mg via INTRAVENOUS

## 2014-07-07 MED ORDER — FENTANYL CITRATE 0.05 MG/ML IJ SOLN
INTRAMUSCULAR | Status: DC | PRN
  Administered 2014-07-07: 250 ug via INTRAVENOUS
  Administered 2014-07-07: 200 ug via INTRAVENOUS
  Administered 2014-07-07 (×2): 250 ug via INTRAVENOUS
  Administered 2014-07-07: 300 ug via INTRAVENOUS
  Administered 2014-07-07: 250 ug via INTRAVENOUS

## 2014-07-07 MED ORDER — HEPARIN SODIUM (PORCINE) 1000 UNIT/ML IJ SOLN
INTRAMUSCULAR | Status: DC | PRN
  Administered 2014-07-07: 38000 [IU] via INTRAVENOUS
  Administered 2014-07-07: 2000 [IU] via INTRAVENOUS

## 2014-07-07 MED ORDER — SODIUM CHLORIDE 0.9 % IV SOLN
INTRAVENOUS | Status: DC
  Administered 2014-07-07: 23:00:00 via INTRAVENOUS
  Filled 2014-07-07 (×2): qty 2.5

## 2014-07-07 MED ORDER — ROCURONIUM BROMIDE 50 MG/5ML IV SOLN
INTRAVENOUS | Status: AC
  Filled 2014-07-07: qty 1

## 2014-07-07 MED ORDER — MIDAZOLAM HCL 2 MG/2ML IJ SOLN: 2.0000 mg | INTRAMUSCULAR | Status: DC | PRN

## 2014-07-07 MED ORDER — SODIUM CHLORIDE 0.9 % IJ SOLN
3.0000 mL | INTRAMUSCULAR | Status: DC | PRN
  Administered 2014-07-08: 3 mL via INTRAVENOUS
  Filled 2014-07-07: qty 3

## 2014-07-07 MED ORDER — FENTANYL CITRATE 0.05 MG/ML IJ SOLN
INTRAMUSCULAR | Status: AC
Start: 2014-07-07 — End: 2014-07-07
  Filled 2014-07-07: qty 5

## 2014-07-07 MED ORDER — ONDANSETRON HCL 4 MG/2ML IJ SOLN
4.0000 mg | Freq: Four times a day (QID) | INTRAMUSCULAR | Status: DC | PRN
Start: 2014-07-07 — End: 2014-07-13
  Administered 2014-07-10: 4 mg via INTRAVENOUS
  Filled 2014-07-07: qty 2

## 2014-07-07 MED ORDER — LACTATED RINGERS IV SOLN
INTRAVENOUS | Status: DC | PRN
  Administered 2014-07-07 (×5): via INTRAVENOUS

## 2014-07-07 MED ORDER — PHENYLEPHRINE HCL 10 MG/ML IJ SOLN
INTRAMUSCULAR | Status: DC | PRN
  Administered 2014-07-07 (×3): 80 ug via INTRAVENOUS
  Administered 2014-07-07: 20 ug via INTRAVENOUS
  Administered 2014-07-07: 40 ug via INTRAVENOUS

## 2014-07-07 MED ORDER — ROCURONIUM BROMIDE 100 MG/10ML IV SOLN
INTRAVENOUS | Status: DC | PRN
  Administered 2014-07-07: 80 mg via INTRAVENOUS
  Administered 2014-07-07: 30 mg via INTRAVENOUS
  Administered 2014-07-07 (×2): 50 mg via INTRAVENOUS

## 2014-07-07 MED ORDER — MAGNESIUM SULFATE 4 GM/100ML IV SOLN
4.0000 g | Freq: Once | INTRAVENOUS | Status: AC
  Administered 2014-07-07: 4 g via INTRAVENOUS
  Filled 2014-07-07: qty 100

## 2014-07-07 MED ORDER — VANCOMYCIN HCL IN DEXTROSE 1-5 GM/200ML-% IV SOLN
1000.0000 mg | Freq: Once | INTRAVENOUS | Status: AC
  Administered 2014-07-07: 1000 mg via INTRAVENOUS
  Filled 2014-07-07: qty 200

## 2014-07-07 MED ORDER — LACTATED RINGERS IV SOLN
INTRAVENOUS | Status: DC
  Administered 2014-07-07: 15:00:00 via INTRAVENOUS

## 2014-07-07 MED ORDER — MIDAZOLAM HCL 10 MG/2ML IJ SOLN
INTRAMUSCULAR | Status: AC
  Filled 2014-07-07: qty 2

## 2014-07-07 MED ORDER — PANTOPRAZOLE SODIUM 40 MG PO TBEC
40.0000 mg | DELAYED_RELEASE_TABLET | Freq: Every day | ORAL | Status: DC
  Administered 2014-07-09 – 2014-07-13 (×5): 40 mg via ORAL
  Filled 2014-07-07 (×4): qty 1

## 2014-07-07 MED ORDER — METOPROLOL TARTRATE 25 MG/10 ML ORAL SUSPENSION
12.5000 mg | Freq: Two times a day (BID) | ORAL | Status: DC
Start: 2014-07-07 — End: 2014-07-09
  Filled 2014-07-07 (×5): qty 5

## 2014-07-07 MED ORDER — POTASSIUM CHLORIDE 10 MEQ/50ML IV SOLN: 10.0000 meq | INTRAVENOUS | Status: AC

## 2014-07-07 MED ORDER — PROTAMINE SULFATE 10 MG/ML IV SOLN
INTRAVENOUS | Status: AC
  Filled 2014-07-07: qty 5

## 2014-07-07 MED ORDER — FENTANYL CITRATE 0.05 MG/ML IJ SOLN
INTRAMUSCULAR | Status: AC
  Filled 2014-07-07: qty 5

## 2014-07-07 MED ORDER — BISACODYL 5 MG PO TBEC
10.0000 mg | DELAYED_RELEASE_TABLET | Freq: Every day | ORAL | Status: DC
  Administered 2014-07-08 – 2014-07-13 (×4): 10 mg via ORAL
  Filled 2014-07-07 (×4): qty 2

## 2014-07-07 MED ORDER — SODIUM CHLORIDE 0.45 % IV SOLN
INTRAVENOUS | Status: DC
  Administered 2014-07-07: 15:00:00 via INTRAVENOUS

## 2014-07-07 MED ORDER — ALBUTEROL SULFATE HFA 108 (90 BASE) MCG/ACT IN AERS
INHALATION_SPRAY | RESPIRATORY_TRACT | Status: DC | PRN
  Administered 2014-07-07: 2 via RESPIRATORY_TRACT

## 2014-07-07 MED ORDER — DEXMEDETOMIDINE HCL IN NACL 400 MCG/100ML IV SOLN
0.1000 ug/kg/h | INTRAVENOUS | Status: AC
  Filled 2014-07-07: qty 100

## 2014-07-07 MED ORDER — 0.9 % SODIUM CHLORIDE (POUR BTL) OPTIME
TOPICAL | Status: DC | PRN
  Administered 2014-07-07 (×2): 1000 mL

## 2014-07-07 MED ORDER — PROTAMINE SULFATE 10 MG/ML IV SOLN
INTRAVENOUS | Status: DC | PRN
  Administered 2014-07-07 (×2): 40 mg via INTRAVENOUS
  Administered 2014-07-07: 50 mg via INTRAVENOUS
  Administered 2014-07-07: 60 mg via INTRAVENOUS
  Administered 2014-07-07: 10 mg via INTRAVENOUS
  Administered 2014-07-07 (×2): 50 mg via INTRAVENOUS

## 2014-07-07 MED ORDER — ACETAMINOPHEN 160 MG/5ML PO SOLN
1000.0000 mg | Freq: Four times a day (QID) | ORAL | Status: AC
  Filled 2014-07-07: qty 40

## 2014-07-07 MED ORDER — PHENYLEPHRINE HCL 10 MG/ML IJ SOLN
0.0000 ug/min | INTRAVENOUS | Status: DC
  Administered 2014-07-08: 25 ug/min via INTRAVENOUS
  Filled 2014-07-07 (×2): qty 2

## 2014-07-07 MED ORDER — SODIUM CHLORIDE 0.9 % IJ SOLN
3.0000 mL | Freq: Two times a day (BID) | INTRAMUSCULAR | Status: DC
  Administered 2014-07-08 – 2014-07-12 (×6): 3 mL via INTRAVENOUS

## 2014-07-07 MED ORDER — ACETAMINOPHEN 500 MG PO TABS
1000.0000 mg | ORAL_TABLET | Freq: Four times a day (QID) | ORAL | Status: AC
  Administered 2014-07-07 – 2014-07-12 (×17): 1000 mg via ORAL
  Filled 2014-07-07 (×23): qty 2

## 2014-07-07 MED ORDER — TRAMADOL HCL 50 MG PO TABS
50.0000 mg | ORAL_TABLET | ORAL | Status: DC | PRN
  Administered 2014-07-12: 50 mg via ORAL
  Administered 2014-07-12: 100 mg via ORAL
  Administered 2014-07-12: 50 mg via ORAL
  Filled 2014-07-07: qty 1
  Filled 2014-07-07: qty 2
  Filled 2014-07-07 (×3): qty 1

## 2014-07-07 MED ORDER — CHLORHEXIDINE GLUCONATE 0.12 % MT SOLN
15.0000 mL | Freq: Once | OROMUCOSAL | Status: AC
  Administered 2014-07-07: 15 mL via OROMUCOSAL
  Filled 2014-07-07: qty 15

## 2014-07-07 MED ORDER — DEXMEDETOMIDINE HCL IN NACL 200 MCG/50ML IV SOLN
0.0000 ug/kg/h | INTRAVENOUS | Status: DC
  Filled 2014-07-07: qty 50

## 2014-07-07 MED ORDER — HEMOSTATIC AGENTS (NO CHARGE) OPTIME
TOPICAL | Status: DC | PRN
  Administered 2014-07-07: 1 via TOPICAL

## 2014-07-07 MED ORDER — FAMOTIDINE IN NACL 20-0.9 MG/50ML-% IV SOLN
20.0000 mg | Freq: Two times a day (BID) | INTRAVENOUS | Status: DC
  Administered 2014-07-07: 20 mg via INTRAVENOUS

## 2014-07-07 MED ORDER — LEVALBUTEROL HCL 0.63 MG/3ML IN NEBU
0.6300 mg | INHALATION_SOLUTION | Freq: Four times a day (QID) | RESPIRATORY_TRACT | Status: DC
  Administered 2014-07-07 – 2014-07-09 (×5): 0.63 mg via RESPIRATORY_TRACT
  Filled 2014-07-07 (×9): qty 3

## 2014-07-07 MED ORDER — PROPOFOL 10 MG/ML IV BOLUS
INTRAVENOUS | Status: AC
  Filled 2014-07-07: qty 20

## 2014-07-07 MED ORDER — PHENYLEPHRINE HCL 10 MG/ML IJ SOLN
10.0000 mg | INTRAMUSCULAR | Status: DC | PRN
  Administered 2014-07-07: 25 ug/min via INTRAVENOUS

## 2014-07-07 MED ORDER — LACTATED RINGERS IV SOLN: 500.0000 mL | Freq: Once | INTRAVENOUS | Status: AC | PRN

## 2014-07-07 MED ORDER — OXYCODONE HCL 5 MG PO TABS
5.0000 mg | ORAL_TABLET | ORAL | Status: DC | PRN
  Administered 2014-07-08 – 2014-07-13 (×16): 10 mg via ORAL
  Filled 2014-07-07 (×16): qty 2

## 2014-07-07 MED ORDER — SODIUM CHLORIDE 0.9 % IJ SOLN
INTRAMUSCULAR | Status: AC
  Filled 2014-07-07: qty 10

## 2014-07-07 MED ORDER — NITROGLYCERIN IN D5W 200-5 MCG/ML-% IV SOLN: 0.0000 ug/min | INTRAVENOUS | Status: DC

## 2014-07-07 MED ORDER — SODIUM CHLORIDE 0.9 % IV SOLN
INTRAVENOUS | Status: DC
  Administered 2014-07-07 – 2014-07-09 (×2): 20 mL/h via INTRAVENOUS

## 2014-07-07 MED ORDER — ALBUMIN HUMAN 5 % IV SOLN
250.0000 mL | INTRAVENOUS | Status: AC | PRN
  Administered 2014-07-07 – 2014-07-08 (×3): 250 mL via INTRAVENOUS
  Filled 2014-07-07: qty 250

## 2014-07-07 MED ORDER — DEXTROSE 5 % IV SOLN
1.5000 g | Freq: Two times a day (BID) | INTRAVENOUS | Status: AC
  Administered 2014-07-07 – 2014-07-09 (×4): 1.5 g via INTRAVENOUS
  Filled 2014-07-07 (×4): qty 1.5

## 2014-07-07 MED ORDER — ROCURONIUM BROMIDE 50 MG/5ML IV SOLN
INTRAVENOUS | Status: AC
  Filled 2014-07-07: qty 4

## 2014-07-07 MED ORDER — SODIUM CHLORIDE 0.9 % IV SOLN
250.0000 mL | INTRAVENOUS | Status: DC
Start: 2014-07-08 — End: 2014-07-10
  Administered 2014-07-07: 250 mL via INTRAVENOUS

## 2014-07-07 MED ORDER — ASPIRIN 81 MG PO CHEW
324.0000 mg | CHEWABLE_TABLET | Freq: Every day | ORAL | Status: DC
  Filled 2014-07-07: qty 4

## 2014-07-07 MED ORDER — ARTIFICIAL TEARS OP OINT
TOPICAL_OINTMENT | OPHTHALMIC | Status: AC
  Filled 2014-07-07: qty 3.5

## 2014-07-07 MED ORDER — CALCIUM CHLORIDE 10 % IV SOLN
INTRAVENOUS | Status: DC | PRN
  Administered 2014-07-07: 200 mg via INTRAVENOUS

## 2014-07-07 MED ORDER — ACETAMINOPHEN 650 MG RE SUPP
650.0000 mg | Freq: Once | RECTAL | Status: AC
  Administered 2014-07-07: 650 mg via RECTAL

## 2014-07-07 MED ORDER — ASPIRIN EC 325 MG PO TBEC
325.0000 mg | DELAYED_RELEASE_TABLET | Freq: Every day | ORAL | Status: DC
  Administered 2014-07-08 – 2014-07-13 (×6): 325 mg via ORAL
  Filled 2014-07-07 (×6): qty 1

## 2014-07-07 MED ORDER — CALCIUM CHLORIDE 10 % IV SOLN
1.0000 g | Freq: Once | INTRAVENOUS | Status: AC
  Administered 2014-07-07: 1 g via INTRAVENOUS
  Filled 2014-07-07: qty 10

## 2014-07-07 MED ORDER — MORPHINE SULFATE 2 MG/ML IJ SOLN
1.0000 mg | INTRAMUSCULAR | Status: AC | PRN
  Administered 2014-07-07: 2 mg via INTRAVENOUS

## 2014-07-07 SURGICAL SUPPLY — 110 items
ADAPTER CARDIO PERF ANTE/RETRO (ADAPTER) ×5 IMPLANT
APPLIER CLIP 9.375 SM OPEN (CLIP)
ATTRACTOMAT 16X20 MAGNETIC DRP (DRAPES) ×5 IMPLANT
BAG DECANTER FOR FLEXI CONT (MISCELLANEOUS) ×5 IMPLANT
BANDAGE ELASTIC 4 VELCRO ST LF (GAUZE/BANDAGES/DRESSINGS) ×5 IMPLANT
BANDAGE ELASTIC 6 VELCRO ST LF (GAUZE/BANDAGES/DRESSINGS) ×5 IMPLANT
BASKET HEART  (ORDER IN 25'S) (MISCELLANEOUS) ×1
BASKET HEART (ORDER IN 25'S) (MISCELLANEOUS) ×1
BASKET HEART (ORDER IN 25S) (MISCELLANEOUS) ×3 IMPLANT
BENZOIN TINCTURE PRP APPL 2/3 (GAUZE/BANDAGES/DRESSINGS) ×5 IMPLANT
BLADE STERNUM SYSTEM 6 (BLADE) ×5 IMPLANT
BLADE SURG 15 STRL LF DISP TIS (BLADE) IMPLANT
BLADE SURG 15 STRL SS (BLADE)
BNDG GAUZE ELAST 4 BULKY (GAUZE/BANDAGES/DRESSINGS) ×5 IMPLANT
CANISTER SUCTION 2500CC (MISCELLANEOUS) ×5 IMPLANT
CANNULA ARTERIAL NVNT 3/8 22FR (MISCELLANEOUS) ×5 IMPLANT
CANNULA EZ GLIDE 8.0 24FR (CANNULA) ×5 IMPLANT
CANNULA EZ GLIDE AORTIC 21FR (CANNULA) ×5 IMPLANT
CANNULA GUNDRY RCSP 15FR (MISCELLANEOUS) ×5 IMPLANT
CARDIAC SUCTION (MISCELLANEOUS) ×5 IMPLANT
CATH CPB KIT HENDRICKSON (MISCELLANEOUS) ×5 IMPLANT
CATH ROBINSON RED A/P 18FR (CATHETERS) ×15 IMPLANT
CATH THORACIC 36FR (CATHETERS) ×5 IMPLANT
CATH THORACIC 36FR RT ANG (CATHETERS) ×5 IMPLANT
CLIP APPLIE 9.375 SM OPEN (CLIP) IMPLANT
CLIP FOGARTY SPRING 6M (CLIP) ×10 IMPLANT
CLIP TI MEDIUM 24 (CLIP) IMPLANT
CLIP TI WIDE RED SMALL 24 (CLIP) ×10 IMPLANT
CLOSURE WOUND 1/2 X4 (GAUZE/BANDAGES/DRESSINGS) ×1
COVER MAYO STAND STRL (DRAPES) IMPLANT
COVER SURGICAL LIGHT HANDLE (MISCELLANEOUS) ×5 IMPLANT
CRADLE DONUT ADULT HEAD (MISCELLANEOUS) ×5 IMPLANT
DRAPE CARDIOVASCULAR INCISE (DRAPES) ×2
DRAPE EXTREMITY T 121X128X90 (DRAPE) ×5 IMPLANT
DRAPE PROXIMA HALF (DRAPES) IMPLANT
DRAPE SLUSH/WARMER DISC (DRAPES) ×5 IMPLANT
DRAPE SRG 135X102X78XABS (DRAPES) ×3 IMPLANT
DRSG AQUACEL AG ADV 3.5X14 (GAUZE/BANDAGES/DRESSINGS) ×5 IMPLANT
DRSG COVADERM 4X14 (GAUZE/BANDAGES/DRESSINGS) ×5 IMPLANT
ELECT REM PT RETURN 9FT ADLT (ELECTROSURGICAL) ×10
ELECTRODE REM PT RTRN 9FT ADLT (ELECTROSURGICAL) ×6 IMPLANT
FLASHLIGHT (MISCELLANEOUS) IMPLANT
GAUZE SPONGE 4X4 12PLY STRL (GAUZE/BANDAGES/DRESSINGS) ×10 IMPLANT
GEL ULTRASOUND 20GR AQUASONIC (MISCELLANEOUS) IMPLANT
GLOVE BIO SURGEON STRL SZ 6 (GLOVE) ×15 IMPLANT
GLOVE BIO SURGEON STRL SZ 6.5 (GLOVE) ×20 IMPLANT
GLOVE BIO SURGEONS STRL SZ 6.5 (GLOVE) ×5
GLOVE SURG SIGNA 7.5 PF LTX (GLOVE) ×15 IMPLANT
GOWN STRL REUS W/ TWL LRG LVL3 (GOWN DISPOSABLE) ×12 IMPLANT
GOWN STRL REUS W/ TWL XL LVL3 (GOWN DISPOSABLE) ×6 IMPLANT
GOWN STRL REUS W/TWL LRG LVL3 (GOWN DISPOSABLE) ×8
GOWN STRL REUS W/TWL XL LVL3 (GOWN DISPOSABLE) ×4
HARMONIC SHEARS 14CM COAG (MISCELLANEOUS) ×5 IMPLANT
HEMOSTAT POWDER SURGIFOAM 1G (HEMOSTASIS) ×15 IMPLANT
HEMOSTAT SURGICEL 2X14 (HEMOSTASIS) ×5 IMPLANT
INSERT FOGARTY XLG (MISCELLANEOUS) ×5 IMPLANT
KIT BASIN OR (CUSTOM PROCEDURE TRAY) ×5 IMPLANT
KIT ROOM TURNOVER OR (KITS) ×5 IMPLANT
KIT SUCTION CATH 14FR (SUCTIONS) ×10 IMPLANT
KIT VASOVIEW W/TROCAR VH 2000 (KITS) ×5 IMPLANT
MARKER GRAFT CORONARY BYPASS (MISCELLANEOUS) ×25 IMPLANT
NS IRRIG 1000ML POUR BTL (IV SOLUTION) ×25 IMPLANT
PACK OPEN HEART (CUSTOM PROCEDURE TRAY) ×5 IMPLANT
PAD ARMBOARD 7.5X6 YLW CONV (MISCELLANEOUS) ×10 IMPLANT
PAD ELECT DEFIB RADIOL ZOLL (MISCELLANEOUS) ×5 IMPLANT
PENCIL BUTTON HOLSTER BLD 10FT (ELECTRODE) ×5 IMPLANT
PUNCH AORTIC ROTATE 4.0MM (MISCELLANEOUS) IMPLANT
PUNCH AORTIC ROTATE 4.5MM 8IN (MISCELLANEOUS) ×5 IMPLANT
PUNCH AORTIC ROTATE 5MM 8IN (MISCELLANEOUS) IMPLANT
SET CARDIOPLEGIA MPS 5001102 (MISCELLANEOUS) ×5 IMPLANT
SPONGE GAUZE 4X4 12PLY STER LF (GAUZE/BANDAGES/DRESSINGS) ×5 IMPLANT
SPONGE LAP 4X18 X RAY DECT (DISPOSABLE) ×5 IMPLANT
STRIP CLOSURE SKIN 1/2X4 (GAUZE/BANDAGES/DRESSINGS) ×4 IMPLANT
SUT BONE WAX W31G (SUTURE) ×5 IMPLANT
SUT ETHIBOND 2 0 SH (SUTURE) ×2
SUT ETHIBOND 2 0 SH 36X2 (SUTURE) ×3 IMPLANT
SUT MNCRL AB 4-0 PS2 18 (SUTURE) ×10 IMPLANT
SUT PROLENE 3 0 SH DA (SUTURE) ×5 IMPLANT
SUT PROLENE 4 0 RB 1 (SUTURE) ×2
SUT PROLENE 4 0 SH DA (SUTURE) IMPLANT
SUT PROLENE 4-0 RB1 .5 CRCL 36 (SUTURE) ×3 IMPLANT
SUT PROLENE 5 0 C 1 36 (SUTURE) ×15 IMPLANT
SUT PROLENE 6 0 C 1 30 (SUTURE) ×15 IMPLANT
SUT PROLENE 7 0 BV 1 (SUTURE) ×5 IMPLANT
SUT PROLENE 7 0 BV1 MDA (SUTURE) ×10 IMPLANT
SUT PROLENE 8 0 BV175 6 (SUTURE) ×10 IMPLANT
SUT STEEL 6MS V (SUTURE) ×5 IMPLANT
SUT STEEL STERNAL CCS#1 18IN (SUTURE) IMPLANT
SUT STEEL SZ 6 DBL 3X14 BALL (SUTURE) ×5 IMPLANT
SUT VIC AB 1 CTX 36 (SUTURE) ×4
SUT VIC AB 1 CTX36XBRD ANBCTR (SUTURE) ×6 IMPLANT
SUT VIC AB 2-0 CT1 27 (SUTURE) ×2
SUT VIC AB 2-0 CT1 TAPERPNT 27 (SUTURE) ×3 IMPLANT
SUT VIC AB 2-0 CTX 27 (SUTURE) IMPLANT
SUT VIC AB 3-0 SH 27 (SUTURE)
SUT VIC AB 3-0 SH 27X BRD (SUTURE) IMPLANT
SUT VIC AB 3-0 X1 27 (SUTURE) IMPLANT
SUT VICRYL 4-0 PS2 18IN ABS (SUTURE) IMPLANT
SUTURE E-PAK OPEN HEART (SUTURE) ×5 IMPLANT
SYR 50ML SLIP (SYRINGE) IMPLANT
SYSTEM SAHARA CHEST DRAIN ATS (WOUND CARE) ×5 IMPLANT
TAPE CLOTH SURG 4X10 WHT LF (GAUZE/BANDAGES/DRESSINGS) ×5 IMPLANT
TAPE STRIPS DRAPE STRL (GAUZE/BANDAGES/DRESSINGS) ×5 IMPLANT
TOWEL OR 17X24 6PK STRL BLUE (TOWEL DISPOSABLE) ×10 IMPLANT
TOWEL OR 17X26 10 PK STRL BLUE (TOWEL DISPOSABLE) ×10 IMPLANT
TRAY FOLEY IC TEMP SENS 16FR (CATHETERS) ×5 IMPLANT
TUBE FEEDING 8FR 16IN STR KANG (MISCELLANEOUS) ×5 IMPLANT
TUBING INSUFFLATION (TUBING) ×5 IMPLANT
UNDERPAD 30X30 INCONTINENT (UNDERPADS AND DIAPERS) ×5 IMPLANT
WATER STERILE IRR 1000ML POUR (IV SOLUTION) ×10 IMPLANT

## 2014-07-07 NOTE — Plan of Care (Signed)
Problem: Consults Goal: Cardiac Surgery Patient Education ( See Patient Education module for education specifics.) Outcome: Completed/Met Date Met:  07/07/14 Goal: Tobacco Cessation referral if indicated Outcome: Completed/Met Date Met:  07/07/14  Problem: Phase I - Pre-Op Goal: Point person for discharge identified Outcome: Completed/Met Date Met:  07/07/14 wife Goal: Pain controlled with appropriate interventions Outcome: Completed/Met Date Met:  07/07/14 Goal: Pre-Op Consults completed as appropriate Outcome: Completed/Met Date Met:  07/07/14 Goal: Pre-Op Education completed Outcome: Completed/Met Date Met:  07/07/14 Goal: Treatment completed per MD order Outcome: Completed/Met Date Met:  07/07/14

## 2014-07-07 NOTE — Progress Notes (Signed)
Post extubation ABG as follows: pH 7.31 pCO2 54.9, pO2 72 bicarb 27 sO2 91.  Pt awake, alert on 5L [place], pulling 500 on IS with good productive cough.  Dr. Tyrone SageGerhardt notified, will continue with pulmonary toilet and repeat ABG in 2 hours.  Will continue to monitor.  Roselie AwkwardShannon Alaura Schippers, RN

## 2014-07-07 NOTE — Transfer of Care (Signed)
Immediate Anesthesia Transfer of Care Note  Patient: Marcelline MatesWilliam Kuhrt  Procedure(s) Performed: Procedure(s): CORONARY ARTERY BYPASS GRAFTING (CABG) times four using right saphenous vein and left internal mammary artery. (N/A) INTRAOPERATIVE TRANSESOPHAGEAL ECHOCARDIOGRAM (N/A) POSSIBLE LEFT RADIAL ARTERY HARVEST (Left)  Patient Location: ICU  Anesthesia Type:General  Level of Consciousness: sedated and unresponsive  Airway & Oxygen Therapy: Patient remains intubated per anesthesia plan and Patient placed on Ventilator (see vital sign flow sheet for setting)  Post-op Assessment: Post -op Vital signs reviewed and stable  Post vital signs: Reviewed and stable  Complications: No apparent anesthesia complications

## 2014-07-07 NOTE — Anesthesia Preprocedure Evaluation (Signed)
Anesthesia Evaluation  Patient identified by MRN, date of birth, ID band Patient awake    Reviewed: Allergy & Precautions, H&P , NPO status , Patient's Chart, lab work & pertinent test results, reviewed documented beta blocker date and time   History of Anesthesia Complications Negative for: history of anesthetic complications  Airway Mallampati: II  TM Distance: >3 FB Neck ROM: Full    Dental  (+) Edentulous Upper, Edentulous Lower   Pulmonary neg shortness of breath, neg sleep apnea, neg COPDneg recent URI, Current Smoker,  breath sounds clear to auscultation        Cardiovascular + angina + CAD - Past MI and - CHF - dysrhythmias Rhythm:Regular     Neuro/Psych negative neurological ROS  negative psych ROS   GI/Hepatic negative GI ROS, Neg liver ROS,   Endo/Other  diabetes, Type obesity  Renal/GU negative Renal ROS     Musculoskeletal   Abdominal   Peds  Hematology negative hematology ROS (+)   Anesthesia Other Findings   Reproductive/Obstetrics                             Anesthesia Physical Anesthesia Plan  ASA: IV  Anesthesia Plan: General   Post-op Pain Management:    Induction: Intravenous  Airway Management Planned: Oral ETT  Additional Equipment: Arterial line, CVP, PA Cath and TEE  Intra-op Plan:   Post-operative Plan: Post-operative intubation/ventilation  Informed Consent: I have reviewed the patients History and Physical, chart, labs and discussed the procedure including the risks, benefits and alternatives for the proposed anesthesia with the patient or authorized representative who has indicated his/her understanding and acceptance.     Plan Discussed with: CRNA and Surgeon  Anesthesia Plan Comments:         Anesthesia Quick Evaluation

## 2014-07-07 NOTE — OR Nursing (Signed)
2nd call to SICU made [id number]

## 2014-07-07 NOTE — OR Nursing (Signed)
!  st call to SICU

## 2014-07-07 NOTE — Progress Notes (Signed)
Dr. Tyrone SageGerhardt notified of weaning parameters and ABG results, agreed on plan for extubation.

## 2014-07-07 NOTE — Progress Notes (Signed)
  Echocardiogram Echocardiogram Transesophageal has been performed.  Antonio Cordova FRANCES 07/07/2014, 10:15 AM

## 2014-07-07 NOTE — Anesthesia Postprocedure Evaluation (Signed)
  Anesthesia Post-op Note  Patient: Marcelline MatesWilliam Eisenberg  Procedure(s) Performed: Procedure(s): CORONARY ARTERY BYPASS GRAFTING (CABG) times four using right saphenous vein and left internal mammary artery. (N/A) INTRAOPERATIVE TRANSESOPHAGEAL ECHOCARDIOGRAM (N/A) POSSIBLE LEFT RADIAL ARTERY HARVEST (Left)  Patient Location: ICU  Anesthesia Type:General  Level of Consciousness: sedated  Airway and Oxygen Therapy: Patient remains intubated per anesthesia plan  Post-op Pain: none  Post-op Assessment: Post-op Vital signs reviewed, Patient's Cardiovascular Status Stable, Respiratory Function Stable and Patent Airway  Post-op Vital Signs: Reviewed and stable  Last Vitals:  Filed Vitals:   07/07/14 1605  BP:   Pulse: 96  Temp: 36.9 C  Resp: 13    Complications: No apparent anesthesia complications

## 2014-07-07 NOTE — Interval H&P Note (Signed)
History and Physical Interval Note:  On reevaluation yesterday, [name]'s test on left was abnormal. Will not use radial 07/07/2014 7:54 AM  Antonio Cordova  has presented today for surgery, with the diagnosis of CAD  The various methods of treatment have been discussed with the patient and family. After consideration of risks, benefits and other options for treatment, the patient has consented to  Procedure(s): CORONARY ARTERY BYPASS GRAFTING (CABG) (N/A) INTRAOPERATIVE TRANSESOPHAGEAL ECHOCARDIOGRAM (N/A) POSSIBLE LEFT RADIAL ARTERY HARVEST (Left) as a surgical intervention .  The patient's history has been reviewed, patient examined, no change in status, stable for surgery.  I have reviewed the patient's chart and labs.  Questions were answered to the patient's satisfaction.     [name] C

## 2014-07-07 NOTE — Anesthesia Postprocedure Evaluation (Signed)
  Anesthesia Post-op Note  Patient: Antonio Cordova  Procedure(s) Performed: Procedure(s): CORONARY ARTERY BYPASS GRAFTING (CABG) times four using right saphenous vein and left internal mammary artery. (N/A) INTRAOPERATIVE TRANSESOPHAGEAL ECHOCARDIOGRAM (N/A) POSSIBLE LEFT RADIAL ARTERY HARVEST (Left)  Patient Location: ICU  Anesthesia Type:General  Level of Consciousness: sedated and unresponsive  Airway and Oxygen Therapy: Patient remains intubated per anesthesia plan and Patient placed on Ventilator (see vital sign flow sheet for setting)  Post-op Pain: none  Post-op Assessment: Post-op Vital signs reviewed, Patient's Cardiovascular Status Stable, Respiratory Function Stable, Patent Airway and No signs of Nausea or vomiting  Post-op Vital Signs: Reviewed and stable  Last Vitals:  Filed Vitals:   07/07/14 1435  BP: 117/77  Pulse: 91  Temp:   Resp: 12    Complications: No apparent anesthesia complications

## 2014-07-07 NOTE — Procedures (Signed)
Extubation Procedure Note  Patient Details:   Name: Antonio Cordova DOB: October 05, 1960 MRN: [id number]   Airway Documentation:     Evaluation  O2 sats: stable throughout Complications: No apparent complications Patient did tolerate procedure well. Bilateral Breath Sounds: Expiratory wheezes Suctioning: Airway, Oral Yes   Patient extubated to 5L nasal cannula.  Positive cuff leak noted.  No evidence of stridor.  Sats currently 91%.  Incentive spirometry performed x3 with goal of 500.  Vitals are stable.    Durwin GlazeBrown, Cecillia Menees N 07/07/2014, 7:12 PM

## 2014-07-07 NOTE — H&P (View-Only) (Signed)
Reason for Consult:3 vessel CAD s/p NQWMI Referring Physician: Dr. [place]  Antonio Cordova is an 53 y.o. male.  HPI: 53 yo man presented with a cc/o chest pain  Antonio Cordova is a 53 yo man with type II DM and a history of tobacco abuse who was awakened from his sleep at ~ 11PM on 11/16 with substernal chest pain. He described this a a sharp pain like being stabbed. He was also short of breath. He called EMS and was brought to the ED.  His initial ECG showed demonstrated diffuse ST depressions in inferior leads and V3-V6. There was an old ASMI. He was started on IV heparin and IV NTG and was given morphine and zofran. His ST depression improved. His initial troponin was 0.01, but he subsequently ruled in for an MI with a troponin of 19. His CXR showed mild pulm edema.   He denies any previous history of CAD or chest pain. He does have DOE which occurs after walking approximately 50 yards on level ground. He can just get up a flight of 10 stairs without needing to take a rest. No family history of heart disease among parents or siblings. He smokes 1.5 PPD. He says he does frequently have wheezing.  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 EF was 45 % with anterior hypokinesis. An echo showed no significant valvular pathology.   Past Medical History  Diagnosis Date  . Diabetes mellitus without complication   . Hyperlipidemia     History reviewed. No pertinent past surgical history.  History reviewed. No pertinent family history.- No family history of CAD, + DM M and MGM  Social History:  reports that he has been smoking.  He has never used smokeless tobacco. He reports that he drinks alcohol. His drug history is not on file.  Allergies: No Known Allergies  Medications:  Prior to Admission:  Prescriptions prior to admission  Medication Sig  Dispense Refill Last Dose  . Acetaminophen (TYLENOL PO) Take 2 tablets by mouth daily as needed (pain).   Past Week at Unknown time    Results for orders placed or performed during the hospital encounter of 07/05/14 (from the past 48 hour(s))  CBC with Differential     Status: Abnormal   Collection Time: 07/05/14 12:41 AM  Result Value Ref Range   WBC 12.6 (H) 4.0 - 10.5 K/uL   RBC 5.25 4.22 - 5.81 MIL/uL   Hemoglobin 16.7 13.0 - 17.0 g/dL   HCT 49.2 39.0 - 52.0 %   MCV 93.7 78.0 - 100.0 fL   MCH 31.8 26.0 - 34.0 pg   MCHC 33.9 30.0 - 36.0 g/dL   RDW 12.6 11.5 - 15.5 %   Platelets 223 150 - 400 K/uL   Neutrophils Relative % 47 43 - 77 %   Neutro Abs 6.0 1.7 - 7.7 K/uL   Lymphocytes Relative 43 12 - 46 %   Lymphs Abs 5.4 (H) 0.7 - 4.0 K/uL   Monocytes Relative 7 3 - 12 %   Monocytes Absolute 0.9 0.1 - 1.0 K/uL   Eosinophils Relative 2 0 - 5 %   Eosinophils Absolute 0.2 0.0 - 0.7 K/uL   Basophils Relative 1 0 - 1 %   Basophils Absolute 0.1 0.0 - 0.1 K/uL  Basic metabolic panel     Status:  Abnormal   Collection Time: 07/05/14 12:41 AM  Result Value Ref Range   Sodium 141 137 - 147 mEq/L   Potassium 4.1 3.7 - 5.3 mEq/L   Chloride 101 96 - 112 mEq/L   CO2 26 19 - 32 mEq/L   Glucose, Bld 162 (H) 70 - 99 mg/dL   BUN 12 6 - 23 mg/dL   Creatinine, Ser 0.70 0.50 - 1.35 mg/dL   Calcium 9.4 8.4 - 10.5 mg/dL   GFR calc non Af Amer >90 >90 mL/min   GFR calc Af Amer >90 >90 mL/min    Comment: (NOTE) The eGFR has been calculated using the CKD EPI equation. This calculation has not been validated in all clinical situations. eGFR's persistently <90 mL/min signify possible Chronic Kidney Disease.    Anion gap 14 5 - 15  Pro b natriuretic peptide (BNP)     Status: None   Collection Time: 07/05/14 12:41 AM  Result Value Ref Range   Pro B Natriuretic peptide (BNP) 10.7 0 - 125 pg/mL  Troponin I     Status: None   Collection Time: 07/05/14 12:41 AM  Result Value Ref Range   Troponin I  <0.30 <0.30 ng/mL    Comment:        Due to the release kinetics of cTnI, a negative result within the first hours of the onset of symptoms does not rule out myocardial infarction with certainty. If myocardial infarction is still suspected, repeat the test at appropriate intervals.   I-Stat Troponin, ED - 0, 3, 6 hours (not at [facility])     Status: None   Collection Time: 07/05/14 12:49 AM  Result Value Ref Range   Troponin i, poc 0.01 0.00 - 0.08 ng/mL   Comment 3            Comment: Due to the release kinetics of cTnI, a negative result within the first hours of the onset of symptoms does not rule out myocardial infarction with certainty. If myocardial infarction is still suspected, repeat the test at appropriate intervals.   I-Stat Chem 8, ED     Status: Abnormal   Collection Time: 07/05/14 12:52 AM  Result Value Ref Range   Sodium 141 137 - 147 mEq/L   Potassium 3.9 3.7 - 5.3 mEq/L   Chloride 101 96 - 112 mEq/L   BUN 12 6 - 23 mg/dL   Creatinine, Ser 0.70 0.50 - 1.35 mg/dL   Glucose, Bld 165 (H) 70 - 99 mg/dL   Calcium, Ion 1.15 1.12 - 1.23 mmol/L   TCO2 28 0 - 100 mmol/L   Hemoglobin 17.7 (H) 13.0 - 17.0 g/dL   HCT 52.0 39.0 - 52.0 %  MRSA PCR Screening     Status: None   Collection Time: 07/05/14  3:04 AM  Result Value Ref Range   MRSA by PCR NEGATIVE NEGATIVE    Comment:        The GeneXpert MRSA Assay (FDA approved for NASAL specimens only), is one component of a comprehensive MRSA colonization surveillance program. It is not intended to diagnose MRSA infection nor to guide or monitor treatment for MRSA infections.   Glucose, capillary     Status: Abnormal   Collection Time: 07/05/14  5:10 AM  Result Value Ref Range   Glucose-Capillary 173 (H) 70 - 99 mg/dL   Comment 1 Capillary Sample   Glucose, capillary     Status: Abnormal   Collection Time: 07/05/14  7:15 AM  Result Value Ref  Range   Glucose-Capillary 145 (H) 70 - 99 mg/dL   Comment 1 Capillary Sample    Basic metabolic panel     Status: Abnormal   Collection Time: 07/05/14  8:50 AM  Result Value Ref Range   Sodium 142 137 - 147 mEq/L   Potassium 4.8 3.7 - 5.3 mEq/L   Chloride 102 96 - 112 mEq/L   CO2 29 19 - 32 mEq/L   Glucose, Bld 119 (H) 70 - 99 mg/dL   BUN 10 6 - 23 mg/dL   Creatinine, Ser 0.62 0.50 - 1.35 mg/dL   Calcium 9.1 8.4 - 10.5 mg/dL   GFR calc non Af Amer >90 >90 mL/min   GFR calc Af Amer >90 >90 mL/min    Comment: (NOTE) The eGFR has been calculated using the CKD EPI equation. This calculation has not been validated in all clinical situations. eGFR's persistently <90 mL/min signify possible Chronic Kidney Disease.    Anion gap 11 5 - 15  CBC     Status: None   Collection Time: 07/05/14  8:50 AM  Result Value Ref Range   WBC 8.4 4.0 - 10.5 K/uL   RBC 4.88 4.22 - 5.81 MIL/uL   Hemoglobin 15.2 13.0 - 17.0 g/dL   HCT 45.2 39.0 - 52.0 %   MCV 92.6 78.0 - 100.0 fL   MCH 31.1 26.0 - 34.0 pg   MCHC 33.6 30.0 - 36.0 g/dL   RDW 12.8 11.5 - 15.5 %   Platelets 186 150 - 400 K/uL  Protime-INR     Status: None   Collection Time: 07/05/14  8:50 AM  Result Value Ref Range   Prothrombin Time 14.4 11.6 - 15.2 seconds   INR 1.10 0.00 - 1.49  Hemoglobin A1c     Status: Abnormal   Collection Time: 07/05/14  8:50 AM  Result Value Ref Range   Hgb A1c MFr Bld 7.2 (H) <5.7 %    Comment: (NOTE)                                                                       According to the ADA Clinical Practice Recommendations for 2011, when HbA1c is used as a screening test:  >=6.5%   Diagnostic of Diabetes Mellitus           (if abnormal result is confirmed) 5.7-6.4%   Increased risk of developing Diabetes Mellitus References:Diagnosis and Classification of Diabetes Mellitus,Diabetes AYTK,1601,09(NATFT 1):S62-S69 and Standards of Medical Care in         Diabetes - 2011,Diabetes Care,2011,34 (Suppl 1):S11-S61.    Mean Plasma Glucose 160 (H) <117 mg/dL    Comment: Performed at  [facility]  Troponin I-(serum)     Status: Abnormal   Collection Time: 07/05/14  8:50 AM  Result Value Ref Range   Troponin I 18.09 (HH) <0.30 ng/mL    Comment:        Due to the release kinetics of cTnI, a negative result within the first hours of the onset of symptoms does not rule out myocardial infarction with certainty. If myocardial infarction is still suspected, repeat the test at appropriate intervals. CRITICAL RESULT CALLED TO, READ BACK BY AND VERIFIED WITH: PAM ALVORD,RN AT [id number] 07/05/14 BY  ZBEECH.   TSH     Status: None   Collection Time: 07/05/14  8:50 AM  Result Value Ref Range   TSH 0.720 0.350 - 4.500 uIU/mL  Magnesium     Status: None   Collection Time: 07/05/14  8:50 AM  Result Value Ref Range   Magnesium 2.0 1.5 - 2.5 mg/dL  Type and screen     Status: None   Collection Time: 07/05/14  8:50 AM  Result Value Ref Range   ABO/RH(D) A POS    Antibody Screen NEG    Sample Expiration 07/08/2014   Lipid panel     Status: Abnormal   Collection Time: 07/05/14  8:50 AM  Result Value Ref Range   Cholesterol 168 0 - 200 mg/dL   Triglycerides 154 (H) <150 mg/dL   HDL 26 (L) >39 mg/dL   Total CHOL/HDL Ratio 6.5 RATIO   VLDL 31 0 - 40 mg/dL   LDL Cholesterol 111 (H) 0 - 99 mg/dL    Comment:        Total Cholesterol/HDL:CHD Risk Coronary Heart Disease Risk Table                     Men   Women  1/2 Average Risk   3.4   3.3  Average Risk       5.0   4.4  2 X Average Risk   9.6   7.1  3 X Average Risk  23.4   11.0        Use the calculated Patient Ratio above and the CHD Risk Table to determine the patient's CHD Risk.        ATP III CLASSIFICATION (LDL):  <100     mg/dL   Optimal  100-129  mg/dL   Near or Above                    Optimal  130-159  mg/dL   Borderline  160-189  mg/dL   High  >190     mg/dL   Very High   Pro b natriuretic peptide (BNP)     Status: None   Collection Time: 07/05/14  8:50 AM  Result Value Ref Range   Pro B  Natriuretic peptide (BNP) 68.5 0 - 125 pg/mL  Heparin level (unfractionated)     Status: None   Collection Time: 07/05/14  8:50 AM  Result Value Ref Range   Heparin Unfractionated 0.35 0.30 - 0.70 IU/mL    Comment:        IF HEPARIN RESULTS ARE BELOW EXPECTED VALUES, AND PATIENT DOSAGE HAS BEEN CONFIRMED, SUGGEST FOLLOW UP TESTING OF ANTITHROMBIN III LEVELS.   ABO/Rh     Status: None   Collection Time: 07/05/14  8:50 AM  Result Value Ref Range   ABO/RH(D) A POS   Glucose, capillary     Status: Abnormal   Collection Time: 07/05/14 11:04 AM  Result Value Ref Range   Glucose-Capillary 113 (H) 70 - 99 mg/dL   Comment 1 Capillary Sample   Troponin I-(serum)     Status: Abnormal   Collection Time: 07/05/14  3:00 PM  Result Value Ref Range   Troponin I 19.58 (HH) <0.30 ng/mL    Comment:        Due to the release kinetics of cTnI, a negative result within the first hours of the onset of symptoms does not rule out myocardial infarction with certainty. If myocardial infarction is still suspected, repeat the  test at appropriate intervals. CRITICAL VALUE NOTED.  VALUE IS CONSISTENT WITH PREVIOUSLY REPORTED AND CALLED VALUE.   Glucose, capillary     Status: Abnormal   Collection Time: 07/05/14  4:10 PM  Result Value Ref Range   Glucose-Capillary 129 (H) 70 - 99 mg/dL   Comment 1 Capillary Sample   Glucose, capillary     Status: Abnormal   Collection Time: 07/05/14  5:27 PM  Result Value Ref Range   Glucose-Capillary 123 (H) 70 - 99 mg/dL   Comment 1 Capillary Sample   Troponin I-(serum)     Status: Abnormal   Collection Time: 07/05/14  8:30 PM  Result Value Ref Range   Troponin I 18.08 (HH) <0.30 ng/mL    Comment:        Due to the release kinetics of cTnI, a negative result within the first hours of the onset of symptoms does not rule out myocardial infarction with certainty. If myocardial infarction is still suspected, repeat the test at appropriate intervals. CRITICAL  VALUE NOTED.  VALUE IS CONSISTENT WITH PREVIOUSLY REPORTED AND CALLED VALUE.   Glucose, capillary     Status: Abnormal   Collection Time: 07/05/14 10:43 PM  Result Value Ref Range   Glucose-Capillary 147 (H) 70 - 99 mg/dL   Comment 1 Capillary Sample   CBC     Status: None   Collection Time: 07/06/14  3:33 AM  Result Value Ref Range   WBC 7.7 4.0 - 10.5 K/uL   RBC 4.71 4.22 - 5.81 MIL/uL   Hemoglobin 14.4 13.0 - 17.0 g/dL   HCT 44.2 39.0 - 52.0 %   MCV 93.8 78.0 - 100.0 fL   MCH 30.6 26.0 - 34.0 pg   MCHC 32.6 30.0 - 36.0 g/dL   RDW 12.9 11.5 - 15.5 %   Platelets 165 150 - 400 K/uL  Heparin level (unfractionated)     Status: Abnormal   Collection Time: 07/06/14  3:33 AM  Result Value Ref Range   Heparin Unfractionated 0.18 (L) 0.30 - 0.70 IU/mL    Comment:        IF HEPARIN RESULTS ARE BELOW EXPECTED VALUES, AND PATIENT DOSAGE HAS BEEN CONFIRMED, SUGGEST FOLLOW UP TESTING OF ANTITHROMBIN III LEVELS.   Comprehensive metabolic panel     Status: Abnormal   Collection Time: 07/06/14  3:33 AM  Result Value Ref Range   Sodium 137 137 - 147 mEq/L   Potassium 4.7 3.7 - 5.3 mEq/L   Chloride 101 96 - 112 mEq/L   CO2 22 19 - 32 mEq/L   Glucose, Bld 122 (H) 70 - 99 mg/dL   BUN 7 6 - 23 mg/dL   Creatinine, Ser 0.61 0.50 - 1.35 mg/dL   Calcium 8.8 8.4 - 10.5 mg/dL   Total Protein 6.1 6.0 - 8.3 g/dL   Albumin 3.5 3.5 - 5.2 g/dL   AST 87 (H) 0 - 37 U/L   ALT 36 0 - 53 U/L   Alkaline Phosphatase 59 39 - 117 U/L   Total Bilirubin 0.5 0.3 - 1.2 mg/dL   GFR calc non Af Amer >90 >90 mL/min   GFR calc Af Amer >90 >90 mL/min    Comment: (NOTE) The eGFR has been calculated using the CKD EPI equation. This calculation has not been validated in all clinical situations. eGFR's persistently <90 mL/min signify possible Chronic Kidney Disease.    Anion gap 14 5 - 15  Glucose, capillary     Status: Abnormal  Collection Time: 07/06/14  7:20 AM  Result Value Ref Range   Glucose-Capillary  135 (H) 70 - 99 mg/dL   Comment 1 Capillary Sample     Dg Chest Port 1 View  07/05/2014   CLINICAL DATA:  Chest pain and shortness of breath  EXAM: PORTABLE CHEST - 1 VIEW  COMPARISON:  None.  FINDINGS: Cardiac shadow is within normal limits. Increased vascular congestion is noted without significant pulmonary edema. No focal infiltrate is seen. No bony abnormality is noted.  IMPRESSION: Mild vascular congestion.   Electronically Signed   By: Inez Catalina M.D.   On: 07/05/2014 01:00    Review of Systems  Constitutional: Positive for malaise/fatigue. Negative for fever and chills.  Respiratory: Positive for cough and shortness of breath.   Cardiovascular: Positive for chest pain. Negative for orthopnea, claudication, leg swelling and PND.  All other systems reviewed and are negative.  Blood pressure 97/69, pulse 83, temperature 97.9 F (36.6 C), temperature source Oral, resp. rate 16, height 6' (1.829 m), weight 276 lb 3.8 oz (125.3 kg), SpO2 97 %. Physical Exam  Vitals reviewed. Constitutional: He is oriented to person, place, and time. No distress.  obese  HENT:  Head: Normocephalic and atraumatic.  Eyes: EOM are normal. Pupils are equal, round, and reactive to light.  Neck: Neck supple. No thyromegaly present.  Cardiovascular: Normal rate, regular rhythm and intact distal pulses.   Murmur (2/6 systolic) heard. Normal [name]'s test on left  Respiratory: Effort normal. He has wheezes (bilateral).  GI: Soft. There is no tenderness.  Musculoskeletal: He exhibits no edema.  Lymphadenopathy:    He has no cervical adenopathy.  Neurological: He is alert and oriented to person, place, and time.  Skin: Skin is warm and dry.   CARDIAC CATHETERIZATION Procedural Findings: Hemodynamics: AO 93/64 mean 77 mm Hg LV 95/20 mm Hg  Coronary angiography: Coronary dominance: right  Left mainstem: The left main is calcified with 30-40% distal disease.   Left anterior descending (LAD): The  LAD is heavily calcified with severe diffuse proximal disease up to 90%. There is a 90% stenosis at the takeoff of the second diagonal.   Ramus intermediate: this is a large branch with diffuse 80% stenosis in the proximal vessel and 70% stenosis in the mid vessel.   Left circumflex (LCx): 100% occluded at the ostium. There are left to left collaterals to an OM branch and right to left collaterals to the distal LCx  Right coronary artery (RCA): The RCA is heavily calcified. There is an 80% proximal stenosis followed by 70% disease in the mid vessel.   Left ventriculography: Left ventricular systolic function is abnormal, there is mid anterior HK. LVEF is estimated at 45%, there is no significant mitral regurgitation   Final Conclusions:  1. Severe 3 vessel obstructive CAD  2. Mild to moderate LV dysfunction.  Assessment/Plan: 53 YO man with DM and tobacco abuse presents with a NQWMI. He has severe 3 vessel CAD with moderate LV dysfunction. CABG is indicated for survival benefit and relief of symptoms.  I discussed the general nature of the procedure, the need for general anesthesia, the use of cardiopulmonary bypass, and the incisions to be used with Antonio Cordova. We also discussed the expected hospital stay, overall recovery and short and long term outcomes. I reviewed the indications, risks, benefits and alternatives. They understand the risks include, but are not limited to death, stroke, MI, DVT/PE, bleeding, possible need for transfusion, infections, and other  organ system dysfunction including respiratory, renal, or GI complications. He accepts the risks and agrees to proceed.  He is not a candidate for bilateral IMA due to DM but has a normal [name]'s test on the left. He has had surgery on the left arm, but will consider use of the left radial artery in addition to LIMA and saphenous vein.  For CABG Thursday 11/19  Mackie Holness C 07/06/2014, 8:22 AM

## 2014-07-07 NOTE — Brief Op Note (Addendum)
07/05/2014 - 07/07/2014  12:22 PM  PATIENT:  Antonio Cordova  53 y.o. male  PRE-OPERATIVE DIAGNOSIS:  CAD  POST-OPERATIVE DIAGNOSIS:  CAD  PROCEDURE:   CORONARY ARTERY BYPASS GRAFTING x 4   LIMA to LAD  SVG to DIAGONAL 2  SVG to Ramus (intramyocardial)  SVG-PD ENDOSCOPIC VEIN HARVEST RIGHT LEG  SURGEON:  Loreli SlotSteven C Shyana Kulakowski, MD  ASSISTANT:  Coral CeoGina Collins, PA-C  ANESTHESIA:   general  PATIENT CONDITION:  ICU - intubated and hemodynamically stable.  PRE-OPERATIVE WEIGHT: 125 kg   XC= 102 min CPB= 156 min

## 2014-07-07 NOTE — Progress Notes (Signed)
Patient ID: Antonio MatesWilliam Inabinet, male   DOB: 1961-04-01, 53 y.o.   MRN: [id number] EVENING ROUNDS NOTE :     [address]       [place] [id number]             [phone]                 Day of Surgery Procedure(s) (LRB): CORONARY ARTERY BYPASS GRAFTING (CABG) times four using right saphenous vein and left internal mammary artery. (N/A) INTRAOPERATIVE TRANSESOPHAGEAL ECHOCARDIOGRAM (N/A) POSSIBLE LEFT RADIAL ARTERY HARVEST (Left)  Total Length of Stay:  LOS: 2 days  BP 99/72 mmHg  Pulse 91  Temp(Src) 99.3 F (37.4 C) (Core (Comment))  Resp 17  Ht 6' (1.829 m)  Wt 275 lb 9.2 oz (125 kg)  BMI 37.37 kg/m2  SpO2 96%  .Intake/Output      11/18 0701 - 11/19 0700 11/19 0701 - 11/20 0700   P.O. 600    I.V. (mL/kg) 662 (5.3) 3780.1 (30.2)   Blood  990   IV Piggyback  700   Total Intake(mL/kg) 1262 (10.1) 5470.1 (43.8)   Urine (mL/kg/hr) 3825 (1.3) 2625 (1.8)   Blood  1475 (1)   Chest Tube  190 (0.1)   Total Output 3825 4290   Net -2563 +1180.1          . sodium chloride 20 mL/hr at 07/07/14 1445  . [START ON 07/08/2014] sodium chloride 250 mL (07/07/14 1445)  . sodium chloride 20 mL/hr (07/07/14 1648)  . dexmedetomidine 0.3 mcg/kg/hr (07/07/14 1800)  . DOPamine 3 mcg/kg/min (07/07/14 1800)  . insulin (NOVOLIN-R) infusion 4 Units/hr (07/07/14 1800)  . lactated ringers 20 mL/hr at 07/07/14 1445  . nitroGLYCERIN 0 mcg/min (07/07/14 1510)  . phenylephrine (NEO-SYNEPHRINE) Adult infusion 20 mcg/min (07/07/14 1800)     Lab Results  Component Value Date   WBC 15.9* 07/07/2014   HGB 14.3 07/07/2014   HCT 42.0 07/07/2014   PLT 122* 07/07/2014   GLUCOSE 139* 07/07/2014   CHOL 168 07/05/2014   TRIG 154* 07/05/2014   HDL 26* 07/05/2014   LDLCALC 111* 07/05/2014   ALT 29 07/07/2014   AST 42* 07/07/2014   NA 138 07/07/2014   K 4.3 07/07/2014   CL 88* 07/07/2014   CREATININE 0.60 07/07/2014   BUN 3* 07/07/2014   CO2 26 07/07/2014   TSH 0.720 07/05/2014   INR  1.26 07/07/2014   HGBA1C 7.2* 07/05/2014   Open eyes but still somnolent Not bleeding Wean vent as tolerated  Delight OvensEdward B Beva Remund MD  Beeper [phone] Office [phone] 07/07/2014 6:50 PM

## 2014-07-07 NOTE — Op Note (Signed)
NAME:  Antonio Cordova, Antonio Cordova NO.:  [id number]  MEDICAL RECORD NO.:  [id number]  LOCATION:  2S16C                        FACILITY:  [facility]  PHYSICIAN:  Revonda Standard. Annamae Shivley, M.D.DATE OF BIRTH:  Dec 15, 1960  DATE OF PROCEDURE:  07/07/2014 DATE OF DISCHARGE:                              OPERATIVE REPORT   PREOPERATIVE DIAGNOSIS:  Severe three-vessel coronary artery disease, status post non-Q-wave MI.  POSTOPERATIVE DIAGNOSIS:  Severe three-vessel coronary artery disease, status post non-Q-wave MI.  PROCEDURE:  Median sternotomy, extracorporeal circulation, coronary artery bypass grafting x4 (left internal mammary artery to left anterior descending, saphenous vein graft to second diagonal, saphenous vein graft to ramus intermedius, saphenous vein graft to posterior descending), endoscopic vein harvest right leg.  SURGEON:  Revonda Standard. Roxan Hockey, MD  FIRST ASSISTANT:  Suzzanne Cloud, PA-C  ANESTHESIA:  General.  FINDINGS:  Morbidly obese, good quality conduits, cardiomegaly, mild aortic insufficiency by TEE, diffusely diseased coronaries, ramus intermedius intramyocardial.  CLINICAL NOTE:  Antonio Cordova is a 53 year old man with multiple cardiac risk factors, who presented with sudden onset of chest pain on the evening of July 04, 2014.  His initial enzymes were negative, but he did rule in for a non-Q-wave MI with a troponin of 19.  This was complicated by a pulmonary edema.  He underwent cardiac catheterization the following day, which revealed severe three-vessel coronary artery disease with an ejection fraction of 45%.  An echocardiogram showed no significant valvular pathology.  He was referred for coronary artery bypass grafting.  The indications, risks, benefits, and alternatives were discussed in detail with the patient.  He understood and accepted the risks and agreed to proceed.  We did assess him for potential use of left radial artery.  However, his  Zenia Resides test was abnormal and it was not felt to be wise to use the radial, therefore, we planned to proceed with left mammary and saphenous vein as conduits.  OPERATIVE NOTE:  [name] was brought to the preoperative holding area on July 07, 2014, there anesthesia placed a Swan-Ganz catheter and an arterial blood pressure monitoring line.  He was taken to the operating room, anesthetized, and intubated.  Intravenous antibiotics were administered.  A Foley catheter was placed.  The chest, abdomen, and legs were prepped and draped in the usual sterile fashion.  Transesophageal echocardiography was performed by Dr. Oleta Mouse of Anesthesia.  It revealed anterior hypokinesis and mild aortic insufficiency.  It was otherwise unremarkable.  An incision was made in the medial aspect of the right leg at the level of the knee.  The greater saphenous vein was harvested from the right thigh endoscopically.  It was a good quality vessel. However, it did become small after it bifurcated below the knee.  Simultaneously with this, a median sternotomy was performed and the left internal mammary artery was harvested using standard technique.  It was a good quality conduit and had excellent flow when divided distally.  2000 units of heparin was administered during the vessel harvest.  The remainder of the full heparin dose was given after harvesting the conduits.  The pericardium was opened after confirming adequate anticoagulation with ACT measurement.  The aorta was cannulated via  concentric 2-0 Ethibond pledgeted pursestring sutures.  It should be noted that the patient had marked cardiomegaly and the ascending aorta itself was relatively short and there was a very short segment of aorta with which to work during the procedure.  A dual-stage venous cannula was placed via pursestring suture in the right atrial appendage.  Cardiopulmonary bypass was instituted and the patient was cooled to  32 degrees Celsius. The coronary arteries were inspected and anastomotic sites were chosen. The distal obtuse marginal branches of the left circumflex were small, diffusely diseased vessels that were not suitable for grafting.  The remaining targets were acceptable for grafting. The ramus intermedius was intramyocardial and was dissected out after cross-clamping. The conduits were inspected and cut to length.  A foam pad was placed in the pericardium to insulate the heart and protect the left phrenic nerve.  A temperature probe was placed in the myocardial septum and a cardioplegia cannula was placed in the ascending aorta.  The aorta was crossclamped.  The left ventricle was emptied via the aortic root vent.  Cardiac arrest then was achieved with a combination of cold antegrade blood cardioplegia and topical iced saline.  With administration of cardioplegia, there was a rapid diastolic arrest. However, septal cooling was very sluggish and the heart became distended.  The decision was made to place a retrograde catheter, but while that was being delivered to the field, we proceeded with grafting of the second diagonal vessel so additional cardioplegia could be given down that graft.  A reversed saphenous vein graft was placed end-to-side to the second diagonal branch of the LAD.  This was the larger of the 2 diagonal branches and bifurcated.  It was grafted just beyond the bifurcation. It was a 1 mm fair quality target vessel.  The vein was of good quality. It was anastomosed end-to-side with a running 7-0 Prolene suture.  There was good flow through the graft.  Cardioplegia was administered.  There was good hemostasis.  While administering cardioplegia down the diagonal vein graft, the right atrium was exposed.  A retrograde cardioplegia cannula was placed via a pursestring suture in the right atrium and advanced into the coronary sinus.  This was then connected to the cardioplegia  circuit and cold retrograde cardioplegia was given.  One liter was administered and there was myocardial septal cooling to 9 degrees Celsius.  Next, a reversed saphenous vein graft was placed end-to-side to the posterior descending branch of the right coronary.  This was a 1.5 mm vessel.  It was only fair quality.  There was some plaque in the artery just proximal to the anastomosis and only a 1 mm probe passed proximally.  The vein graft was anastomosed with a running 7-0 Prolene suture.  At the completion of the anastomosis, cardioplegia was administered and there was good flow and good hemostasis.  Next, the heart was elevated.  The distal circumflex branches were again inspected and these were very small diffusely diseased vessels not suitable for grafting.  There was some scarring in this area.  The ramus intermedius was dissected out.  It was a 1.5 mm target vessel.  It was intramyocardial.  It was relatively spared of disease distally, there was significant plaquing proximally.  The vein graft was anastomosed end- to-side with a running 7-0 Prolene suture.  At the completion of the anastomosis, a probe passed easily proximally and distally.  Cardioplegia was administered.  There was good flow and good hemostasis.  Additional cardioplegia was administered  via the retrograde cannula as well.  The left internal mammary artery was brought through a window in the pericardium.  The distal end was beveled.  It was then anastomosed end- to-side to the distal LAD.  The LAD was a very diffusely diseased vessel with a palpable plaque throughout its course.  There was no area completely free of disease in which to place the graft.  The graft was placed relatively distally on the vessel.  There was some plaque at the site of the anastomosis.  The LAD was a fair quality target.  The 1.5 mm probe did pass distally to the apex.  There was some plaque just proximal to the anastomosis, where  the probe met with some slight resistance before passing more proximally.  The left mammary was anastomosed end-to-side to the LAD with a running 8-0 Prolene suture. At the completion of the anastomosis, the bulldog clamp was briefly removed to inspect for hemostasis.  Rapid septal rewarming was noted. The bulldog clamp was replaced and the mammary pedicle was tacked to the epicardial surface of the heart with 6-0 Prolene sutures.  Additional cardioplegia was administered down the vein graft and then via the retrograde cannula while the vein grafts were cut to length. Because of length issues as well as aortic issues, the decision was made to take the diagonal vein graft as a Y-graft off of the ramus graft. The ramus graft and posterior descending grafts were sewn end-to-side to 4.5 mm punch aortotomies with running 6-0 Prolene sutures.  At the completion of the second proximal anastomosis, the patient was placed in Trendelenburg position.  Lidocaine was administered.  A warm dose of retrograde cardioplegia was administered.  The bulldog clamp was again removed from the left mammary artery and rapid septal rewarming was again noted.  After administering the retrograde cardioplegia and de- airing the aortic root, the aortic crossclamp was removed.  The total crossclamp time was 102 minutes.  The patient spontaneously resumed sinus rhythm and did not require defibrillation.  Bulldog clamps were placed proximally and distally on the ramus vein.  The proximal end of the diagonal vein was beveled and was sewn end-to-side to the ramus vein with a running 7-0 Prolene suture.  The anastomosis was de-aired distally prior to removing the proximal bulldog clamp and tying the suture.  While rewarming was completed, all proximal and distal anastomoses were inspected for hemostasis.  The retrograde cardioplegia cannula was removed.  Epicardial pacing wires were placed on the right ventricle  and right atrium.  When the patient had rewarmed to a core temperature of 37 degrees Celsius, he was weaned from cardiopulmonary bypass on the first attempt.  The total bypass time was 156 minutes.  He was in sinus rhythm at 80 beats per minute and on no inotropic support at the time of separation from bypass.  The initial cardiac index was approximately 2 L/minute/m2 and the patient remained hemodynamically stable throughout the post bypass period.  The post bypass transesophageal echocardiography study was unchanged from the prebypass study.  A test dose of protamine was administered and was well tolerated.  The atrial and aortic cannulae were removed.  The remainder of the protamine was administered without incident.  The chest was irrigated with warm saline.  Hemostasis was achieved.  The pericardium could not be reapproximated.  A left pleural and mediastinal chest tubes were placed via separate subcostal incisions.  The sternum was closed with a combination of single and double heavy gauge stainless  steel wires.  The pectoralis fascia, subcutaneous tissue, and skin were closed in standard fashion.  The sponge and instrument counts were correct at the end of procedure.  There was a missing needle from an 8-0 Prolene suture which made the final count incorrect.  The patient was transported from the operating room to the surgical intensive care unit in good condition.     Revonda Standard Roxan Hockey, M.D.     SCH/MEDQ  D:  07/07/2014  T:  07/07/2014  Job:  [id number]

## 2014-07-08 ENCOUNTER — Inpatient Hospital Stay (HOSPITAL_COMMUNITY): Payer: BC Managed Care – PPO

## 2014-07-08 ENCOUNTER — Encounter (HOSPITAL_COMMUNITY): Payer: Self-pay | Admitting: Thoracic Surgery (Cardiothoracic Vascular Surgery)

## 2014-07-08 LAB — GLUCOSE, CAPILLARY
GLUCOSE-CAPILLARY: 131 mg/dL — AB (ref 70–99)
GLUCOSE-CAPILLARY: 118 mg/dL — AB (ref 70–99)
GLUCOSE-CAPILLARY: 158 mg/dL — AB (ref 70–99)
GLUCOSE-CAPILLARY: 116 mg/dL — AB (ref 70–99)
GLUCOSE-CAPILLARY: 109 mg/dL — AB (ref 70–99)
Glucose-Capillary: 101 mg/dL — ABNORMAL HIGH (ref 70–99)
Glucose-Capillary: 103 mg/dL — ABNORMAL HIGH (ref 70–99)
Glucose-Capillary: 108 mg/dL — ABNORMAL HIGH (ref 70–99)
Glucose-Capillary: 109 mg/dL — ABNORMAL HIGH (ref 70–99)
Glucose-Capillary: 112 mg/dL — ABNORMAL HIGH (ref 70–99)
Glucose-Capillary: 113 mg/dL — ABNORMAL HIGH (ref 70–99)
Glucose-Capillary: 113 mg/dL — ABNORMAL HIGH (ref 70–99)
Glucose-Capillary: 117 mg/dL — ABNORMAL HIGH (ref 70–99)
Glucose-Capillary: 120 mg/dL — ABNORMAL HIGH (ref 70–99)
Glucose-Capillary: 121 mg/dL — ABNORMAL HIGH (ref 70–99)
Glucose-Capillary: 131 mg/dL — ABNORMAL HIGH (ref 70–99)
Glucose-Capillary: 132 mg/dL — ABNORMAL HIGH (ref 70–99)
Glucose-Capillary: 135 mg/dL — ABNORMAL HIGH (ref 70–99)
Glucose-Capillary: 139 mg/dL — ABNORMAL HIGH (ref 70–99)
Glucose-Capillary: 139 mg/dL — ABNORMAL HIGH (ref 70–99)
Glucose-Capillary: 153 mg/dL — ABNORMAL HIGH (ref 70–99)
Glucose-Capillary: 97 mg/dL (ref 70–99)
Glucose-Capillary: 98 mg/dL (ref 70–99)
Glucose-Capillary: 99 mg/dL (ref 70–99)

## 2014-07-08 LAB — POCT I-STAT, CHEM 8
BUN: 6 mg/dL (ref 6–23)
CREATININE: 0.7 mg/dL (ref 0.50–1.35)
Calcium, Ion: 1.18 mmol/L (ref 1.12–1.23)
Chloride: 96 mEq/L (ref 96–112)
Glucose, Bld: 138 mg/dL — ABNORMAL HIGH (ref 70–99)
HCT: 40 % (ref 39.0–52.0)
Hemoglobin: 13.6 g/dL (ref 13.0–17.0)
Potassium: 4 mEq/L (ref 3.7–5.3)
Sodium: 136 mEq/L — ABNORMAL LOW (ref 137–147)
TCO2: 28 mmol/L (ref 0–100)

## 2014-07-08 LAB — BASIC METABOLIC PANEL
Anion gap: 10 (ref 5–15)
BUN: 6 mg/dL (ref 6–23)
CO2: 26 mEq/L (ref 19–32)
Calcium: 8.3 mg/dL — ABNORMAL LOW (ref 8.4–10.5)
Chloride: 102 mEq/L (ref 96–112)
Creatinine, Ser: 0.59 mg/dL (ref 0.50–1.35)
Glucose, Bld: 110 mg/dL — ABNORMAL HIGH (ref 70–99)
Potassium: 4.3 mEq/L (ref 3.7–5.3)
Sodium: 138 mEq/L (ref 137–147)

## 2014-07-08 LAB — MAGNESIUM
MAGNESIUM: 2 mg/dL (ref 1.5–2.5)
Magnesium: 2 mg/dL (ref 1.5–2.5)

## 2014-07-08 LAB — CBC
HCT: 37.1 % — ABNORMAL LOW (ref 39.0–52.0)
HEMATOCRIT: 38.9 % — AB (ref 39.0–52.0)
Hemoglobin: 13.3 g/dL (ref 13.0–17.0)
Hemoglobin: 12.6 g/dL — ABNORMAL LOW (ref 13.0–17.0)
MCH: 30.9 pg (ref 26.0–34.0)
MCH: 30.9 pg (ref 26.0–34.0)
MCHC: 34.2 g/dL (ref 30.0–36.0)
MCHC: 34 g/dL (ref 30.0–36.0)
MCV: 90.3 fL (ref 78.0–100.0)
MCV: 90.9 fL (ref 78.0–100.0)
PLATELETS: 123 10*3/uL — AB (ref 150–400)
Platelets: 126 10*3/uL — ABNORMAL LOW (ref 150–400)
RBC: 4.31 MIL/uL (ref 4.22–5.81)
RBC: 4.08 MIL/uL — ABNORMAL LOW (ref 4.22–5.81)
RDW: 12.6 % (ref 11.5–15.5)
RDW: 12.7 % (ref 11.5–15.5)
WBC: 13.1 10*3/uL — ABNORMAL HIGH (ref 4.0–10.5)
WBC: 14.1 10*3/uL — AB (ref 4.0–10.5)

## 2014-07-08 LAB — CREATININE, SERUM: Creatinine, Ser: 0.67 mg/dL (ref 0.50–1.35)

## 2014-07-08 MED ORDER — ENOXAPARIN SODIUM 40 MG/0.4ML ~~LOC~~ SOLN
40.0000 mg | Freq: Every day | SUBCUTANEOUS | Status: DC
  Administered 2014-07-08 – 2014-07-12 (×5): 40 mg via SUBCUTANEOUS
  Filled 2014-07-08 (×8): qty 0.4

## 2014-07-08 MED ORDER — INSULIN DETEMIR 100 UNIT/ML ~~LOC~~ SOLN
40.0000 [IU] | Freq: Every day | SUBCUTANEOUS | Status: DC
  Administered 2014-07-09 – 2014-07-11 (×3): 40 [IU] via SUBCUTANEOUS
  Filled 2014-07-08 (×5): qty 0.4

## 2014-07-08 MED ORDER — INSULIN DETEMIR 100 UNIT/ML ~~LOC~~ SOLN
40.0000 [IU] | Freq: Once | SUBCUTANEOUS | Status: AC
  Administered 2014-07-08: 40 [IU] via SUBCUTANEOUS
  Filled 2014-07-08 (×2): qty 0.4

## 2014-07-08 MED ORDER — INSULIN ASPART 100 UNIT/ML ~~LOC~~ SOLN
0.0000 [IU] | SUBCUTANEOUS | Status: DC
  Administered 2014-07-08 – 2014-07-10 (×8): 2 [IU] via SUBCUTANEOUS

## 2014-07-08 MED ORDER — FUROSEMIDE 10 MG/ML IJ SOLN
20.0000 mg | Freq: Two times a day (BID) | INTRAMUSCULAR | Status: AC
  Administered 2014-07-08 (×2): 20 mg via INTRAVENOUS
  Filled 2014-07-08: qty 2

## 2014-07-08 MED FILL — Lidocaine HCl IV Inj 20 MG/ML: INTRAVENOUS | Qty: 5 | Status: AC

## 2014-07-08 MED FILL — Potassium Chloride Inj 2 mEq/ML: INTRAVENOUS | Qty: 40 | Status: AC

## 2014-07-08 MED FILL — Electrolyte-R (PH 7.4) Solution: INTRAVENOUS | Qty: 3000 | Status: AC

## 2014-07-08 MED FILL — Heparin Sodium (Porcine) Inj 1000 Unit/ML: INTRAMUSCULAR | Qty: 30 | Status: AC

## 2014-07-08 MED FILL — Heparin Sodium (Porcine) Inj 1000 Unit/ML: INTRAMUSCULAR | Qty: 10 | Status: AC

## 2014-07-08 MED FILL — Mannitol IV Soln 20%: INTRAVENOUS | Qty: 500 | Status: AC

## 2014-07-08 MED FILL — Sodium Bicarbonate IV Soln 8.4%: INTRAVENOUS | Qty: 50 | Status: AC

## 2014-07-08 MED FILL — Magnesium Sulfate Inj 50%: INTRAMUSCULAR | Qty: 10 | Status: AC

## 2014-07-08 MED FILL — Sodium Chloride IV Soln 0.9%: INTRAVENOUS | Qty: 2000 | Status: AC

## 2014-07-08 NOTE — Progress Notes (Signed)
UR Completed.  Antonio Cordova [phone] 07/08/2014  

## 2014-07-08 NOTE — Plan of Care (Signed)
Problem: Phase II - Intermediate Post-Op Goal: Maintain Hemodynamic Stability Outcome: Completed/Met Date Met:  07/08/14 Goal: CBGs/Blood Glucose per SCIP Criteria Outcome: Completed/Met Date Met:  07/08/14 Goal: Pain controlled with appropriate interventions Outcome: Completed/Met Date Met:  07/08/14 Goal: Advance Diet Outcome: Completed/Met Date Met:  07/08/14 Goal: Activity Progressed Outcome: Completed/Met Date Met:  07/08/14

## 2014-07-08 NOTE — Progress Notes (Signed)
Inpatient Diabetes Program Recommendations  AACE/ADA: New Consensus Statement on Inpatient Glycemic Control (2013)  Target Ranges:  Prepandial:   less than 140 mg/dL      Peak postprandial:   less than 180 mg/dL (1-2 hours)      Critically ill patients:  140 - 180 mg/dL   Reason for Assessment:  Results for Antonio Cordova, [name] (MRN [id number]) as of 07/08/2014 13:18  Ref. Range 07/05/2014 08:50  Hgb A1c MFr Bld Latest Range: <5.7 % 7.2 (H)    Diabetes history: Type 2 diabetes Outpatient Diabetes medications: None listed Current orders for Inpatient glycemic control:  Note patient transitioning off insulin drip to Levemir 40 units daily.  Based on A1C, patient will not need insulin at home however may benefit from the start of oral agent for diabetes.  Likely will be able to titrate Levemir down and eventually stopped during hospitalization.   Will follow. Thanks, Beryl MeagerJenny Anuar Walgren, RN, BC-ADM Inpatient Diabetes Coordinator Pager [phone]

## 2014-07-08 NOTE — Plan of Care (Signed)
Problem: Phase I - Pre-Op Goal: Patient progressed to Phase II; barriers addressed Outcome: Completed/Met Date Met:  07/08/14  Problem: Phase II - Intermediate Post-Op Goal: Wean to Extubate Outcome: Completed/Met Date Met:  07/08/14

## 2014-07-08 NOTE — Progress Notes (Signed)
CT surgery p.m. Rounds Patient examined and record reviewed.Hemodynamics stable,labs satisfactory.Patient had stable day.Continue current care. VAN TRIGT III,Antonio Cordova 07/08/2014   

## 2014-07-09 ENCOUNTER — Inpatient Hospital Stay (HOSPITAL_COMMUNITY): Payer: BC Managed Care – PPO

## 2014-07-09 LAB — GLUCOSE, CAPILLARY
GLUCOSE-CAPILLARY: 120 mg/dL — AB (ref 70–99)
GLUCOSE-CAPILLARY: 161 mg/dL — AB (ref 70–99)
GLUCOSE-CAPILLARY: 145 mg/dL — AB (ref 70–99)
Glucose-Capillary: 107 mg/dL — ABNORMAL HIGH (ref 70–99)
Glucose-Capillary: 116 mg/dL — ABNORMAL HIGH (ref 70–99)
Glucose-Capillary: 130 mg/dL — ABNORMAL HIGH (ref 70–99)
Glucose-Capillary: 109 mg/dL — ABNORMAL HIGH (ref 70–99)

## 2014-07-09 LAB — CBC
HEMATOCRIT: 34.8 % — AB (ref 39.0–52.0)
HEMOGLOBIN: 11.6 g/dL — AB (ref 13.0–17.0)
MCH: 30.7 pg (ref 26.0–34.0)
MCHC: 33.3 g/dL (ref 30.0–36.0)
MCV: 92.1 fL (ref 78.0–100.0)
Platelets: 89 10*3/uL — ABNORMAL LOW (ref 150–400)
RBC: 3.78 MIL/uL — ABNORMAL LOW (ref 4.22–5.81)
RDW: 12.9 % (ref 11.5–15.5)
WBC: 14.2 10*3/uL — ABNORMAL HIGH (ref 4.0–10.5)

## 2014-07-09 LAB — BASIC METABOLIC PANEL
Anion gap: 11 (ref 5–15)
BUN: 9 mg/dL (ref 6–23)
CHLORIDE: 97 meq/L (ref 96–112)
CO2: 29 mEq/L (ref 19–32)
Calcium: 8.5 mg/dL (ref 8.4–10.5)
Creatinine, Ser: 0.67 mg/dL (ref 0.50–1.35)
GFR calc Af Amer: >90 mL/min (ref >90)
GFR calc non Af Amer: >90 mL/min (ref >90)
GLUCOSE: 124 mg/dL — AB (ref 70–99)
Potassium: 4.4 mEq/L (ref 3.7–5.3)
Sodium: 137 mEq/L (ref 137–147)

## 2014-07-09 MED ORDER — DIGOXIN 250 MCG PO TABS
0.2500 mg | ORAL_TABLET | Freq: Every day | ORAL | Status: DC
  Administered 2014-07-09 – 2014-07-13 (×5): 0.25 mg via ORAL
  Filled 2014-07-09 (×5): qty 1

## 2014-07-09 MED ORDER — FUROSEMIDE 40 MG PO TABS
40.0000 mg | ORAL_TABLET | Freq: Every day | ORAL | Status: DC
  Administered 2014-07-09 – 2014-07-11 (×3): 40 mg via ORAL
  Filled 2014-07-09 (×5): qty 1

## 2014-07-09 MED ORDER — LEVALBUTEROL HCL 0.63 MG/3ML IN NEBU
0.6300 mg | INHALATION_SOLUTION | Freq: Four times a day (QID) | RESPIRATORY_TRACT | Status: DC | PRN
  Administered 2014-07-09: 0.63 mg via RESPIRATORY_TRACT
  Filled 2014-07-09: qty 3

## 2014-07-09 MED ORDER — METOPROLOL TARTRATE 25 MG PO TABS
25.0000 mg | ORAL_TABLET | Freq: Two times a day (BID) | ORAL | Status: DC
  Administered 2014-07-09 – 2014-07-13 (×8): 25 mg via ORAL
  Filled 2014-07-09 (×11): qty 1

## 2014-07-09 NOTE — Plan of Care (Signed)
Problem: Phase III - Recovery through Discharge Goal: Maintain Hemodynamic Stability Outcome: Completed/Met Date Met:  07/09/14 Goal: Activity Progressed Outcome: Completed/Met Date Met:  07/09/14

## 2014-07-09 NOTE — Progress Notes (Signed)
2 Days Post-Op Procedure(s) (LRB): CORONARY ARTERY BYPASS GRAFTING (CABG) times four using right saphenous vein and left internal mammary artery. (N/A) INTRAOPERATIVE TRANSESOPHAGEAL ECHOCARDIOGRAM (N/A) POSSIBLE LEFT RADIAL ARTERY HARVEST (Left) Subjective: Wheezing, tachy Ambulating Starting lasix  Objective: Vital signs in last 24 hours: Temp:  [97.6 F (36.4 C)-98.3 F (36.8 C)] 97.6 F (36.4 C) (11/21 1200) Pulse Rate:  [91-126] 118 (11/21 1900) Cardiac Rhythm:  [-] Sinus tachycardia (11/21 1600) Resp:  [14-30] 28 (11/21 1900) BP: (94-131)/(57-82) 95/69 mmHg (11/21 1900) SpO2:  [91 %-98 %] 93 % (11/21 1900) Weight:  [281 lb 15.5 oz (127.9 kg)] 281 lb 15.5 oz (127.9 kg) (11/21 0500)  Hemodynamic parameters for last 24 hours:  stable  Intake/Output from previous day: 11/20 0701 - 11/21 0700 In: 1076 [P.O.:480; I.V.:496; IV Piggyback:100] Out: 1930 [Urine:1880; Chest Tube:50] Intake/Output this shift:      Lab Results:  Recent Labs  07/08/14 1645 07/09/14 0335  WBC 14.1* 14.2*  HGB 12.6* 11.6*  HCT 37.1* 34.8*  PLT 123* 89*   BMET:  Recent Labs  07/08/14 0355 07/08/14 1640 07/08/14 1645 07/09/14 0335  NA 138 136*  --  137  K 4.3 4.0  --  4.4  CL 102 96  --  97  CO2 26  --   --  29  GLUCOSE 110* 138*  --  124*  BUN 6 6  --  9  CREATININE 0.59 0.70 0.67 0.67  CALCIUM 8.3*  --   --  8.5    PT/INR:  Recent Labs  07/07/14 1435  LABPROT 15.9*  INR 1.26   ABG    Component Value Date/Time   PHART 7.348* 07/07/2014 2213   HCO3 26.4* 07/07/2014 2213   TCO2 28 07/08/2014 1640   O2SAT 91.0 07/07/2014 2213   CBG (last 3)   Recent Labs  07/09/14 0823 07/09/14 1203 07/09/14 1637  GLUCAP 130* 109* 161*    Assessment/Plan: S/P Procedure(s) (LRB): CORONARY ARTERY BYPASS GRAFTING (CABG) times four using right saphenous vein and left internal mammary artery. (N/A) INTRAOPERATIVE TRANSESOPHAGEAL ECHOCARDIOGRAM (N/A) POSSIBLE LEFT RADIAL ARTERY  HARVEST (Left) Treat COPD, DM tachycardia   LOS: 4 days    VAN TRIGT III,PETER 07/09/2014

## 2014-07-10 ENCOUNTER — Inpatient Hospital Stay (HOSPITAL_COMMUNITY): Payer: BC Managed Care – PPO

## 2014-07-10 LAB — BASIC METABOLIC PANEL
Anion gap: 15 (ref 5–15)
BUN: 10 mg/dL (ref 6–23)
CO2: 27 mEq/L (ref 19–32)
Calcium: 8.6 mg/dL (ref 8.4–10.5)
Chloride: 95 mEq/L — ABNORMAL LOW (ref 96–112)
Creatinine, Ser: 0.6 mg/dL (ref 0.50–1.35)
GFR calc Af Amer: >90 mL/min (ref >90)
GFR calc non Af Amer: >90 mL/min (ref >90)
Glucose, Bld: 118 mg/dL — ABNORMAL HIGH (ref 70–99)
Potassium: 3.9 mEq/L (ref 3.7–5.3)
Sodium: 137 mEq/L (ref 137–147)

## 2014-07-10 LAB — GLUCOSE, CAPILLARY
GLUCOSE-CAPILLARY: 127 mg/dL — AB (ref 70–99)
GLUCOSE-CAPILLARY: 132 mg/dL — AB (ref 70–99)
GLUCOSE-CAPILLARY: 133 mg/dL — AB (ref 70–99)
GLUCOSE-CAPILLARY: 159 mg/dL — AB (ref 70–99)
GLUCOSE-CAPILLARY: 164 mg/dL — AB (ref 70–99)
Glucose-Capillary: 128 mg/dL — ABNORMAL HIGH (ref 70–99)

## 2014-07-10 LAB — CBC
HCT: 33.3 % — ABNORMAL LOW (ref 39.0–52.0)
Hemoglobin: 11.4 g/dL — ABNORMAL LOW (ref 13.0–17.0)
MCH: 31.8 pg (ref 26.0–34.0)
MCHC: 34.2 g/dL (ref 30.0–36.0)
MCV: 93 fL (ref 78.0–100.0)
Platelets: 150 10*3/uL (ref 150–400)
RBC: 3.58 MIL/uL — ABNORMAL LOW (ref 4.22–5.81)
RDW: 12.9 % (ref 11.5–15.5)
WBC: 14.8 10*3/uL — ABNORMAL HIGH (ref 4.0–10.5)

## 2014-07-10 MED ORDER — INSULIN ASPART 100 UNIT/ML ~~LOC~~ SOLN
0.0000 [IU] | Freq: Three times a day (TID) | SUBCUTANEOUS | Status: DC
  Administered 2014-07-10 – 2014-07-11 (×4): 2 [IU] via SUBCUTANEOUS

## 2014-07-10 MED ORDER — SODIUM CHLORIDE 0.9 % IJ SOLN
3.0000 mL | Freq: Two times a day (BID) | INTRAMUSCULAR | Status: DC
  Administered 2014-07-10 – 2014-07-13 (×4): 3 mL via INTRAVENOUS

## 2014-07-10 MED ORDER — FUROSEMIDE 10 MG/ML IJ SOLN
40.0000 mg | Freq: Once | INTRAMUSCULAR | Status: AC
  Administered 2014-07-10: 40 mg via INTRAVENOUS
  Filled 2014-07-10: qty 4

## 2014-07-10 MED ORDER — SODIUM CHLORIDE 0.9 % IJ SOLN: 3.0000 mL | INTRAMUSCULAR | Status: DC | PRN

## 2014-07-10 MED ORDER — LACTULOSE 10 GM/15ML PO SOLN
30.0000 g | Freq: Every day | ORAL | Status: DC
  Administered 2014-07-10 – 2014-07-13 (×2): 30 g via ORAL
  Filled 2014-07-10 (×4): qty 45

## 2014-07-10 MED ORDER — SODIUM CHLORIDE 0.9 % IV SOLN
250.0000 mL | INTRAVENOUS | Status: DC | PRN
Start: 2014-07-10 — End: 2014-07-13

## 2014-07-10 MED ORDER — MOVING RIGHT ALONG BOOK
Freq: Once | Status: AC
  Administered 2014-07-10: 18:00:00
  Filled 2014-07-10: qty 1

## 2014-07-10 MED ORDER — MAGNESIUM HYDROXIDE 400 MG/5ML PO SUSP: 30.0000 mL | Freq: Every day | ORAL | Status: DC | PRN

## 2014-07-10 MED ORDER — POTASSIUM CHLORIDE CRYS ER 20 MEQ PO TBCR
20.0000 meq | EXTENDED_RELEASE_TABLET | Freq: Every day | ORAL | Status: DC
  Administered 2014-07-10 – 2014-07-13 (×4): 20 meq via ORAL
  Filled 2014-07-10 (×4): qty 1

## 2014-07-10 NOTE — Progress Notes (Signed)
Pt ambulated approximately 36050ft with RW and assist x1.  Pt tolerated well with no complaints.  Pt O2 94% on 2L.  Pt back to side of bed.  Will continue to monitor.

## 2014-07-10 NOTE — Progress Notes (Signed)
3 Days Post-Op Procedure(s) (LRB): CORONARY ARTERY BYPASS GRAFTING (CABG) times four using right saphenous vein and left internal mammary artery. (N/A) INTRAOPERATIVE TRANSESOPHAGEAL ECHOCARDIOGRAM (N/A) POSSIBLE LEFT RADIAL ARTERY HARVEST (Left) Subjective: Doing well , sinus tachycardia Lungs clear Ambulating well Surgical incisions clean and dry                                                                                          weight still up 10 pounds-continue Lasix                                                                         Objective: Vital signs in last 24 hours: Temp:  [97.6 F (36.4 C)-98.2 F (36.8 C)] 97.8 F (36.6 C) (11/22 0716) Pulse Rate:  [93-126] 110 (11/22 1000) Cardiac Rhythm:  [-] Sinus tachycardia (11/22 1000) Resp:  [14-29] 22 (11/22 1000) BP: (95-131)/(55-77) 123/77 mmHg (11/22 1000) SpO2:  [91 %-98 %] 92 % (11/22 1000) Weight:  [281 lb 8.4 oz (127.7 kg)] 281 lb 8.4 oz (127.7 kg) (11/22 0700)  Hemodynamic parameters for last 24 hours:    Intake/Output from previous day: 11/21 0701 - 11/22 0700 In: 580 [P.O.:120; I.V.:460] Out: 1125 [Urine:1125] Intake/Output this shift: Total I/O In: 40 [I.V.:40] Out: 175 [Urine:175]    Lab Results:  Recent Labs  07/09/14 0335 07/10/14 0427  WBC 14.2* 14.8*  HGB 11.6* 11.4*  HCT 34.8* 33.3*  PLT 89* 150   BMET:  Recent Labs  07/09/14 0335 07/10/14 0427  NA 137 137  K 4.4 3.9  CL 97 95*  CO2 29 27  GLUCOSE 124* 118*  BUN 9 10  CREATININE 0.67 0.60  CALCIUM 8.5 8.6    PT/INR:  Recent Labs  07/07/14 1435  LABPROT 15.9*  INR 1.26   ABG    Component Value Date/Time   PHART 7.348* 07/07/2014 2213   HCO3 26.4* 07/07/2014 2213   TCO2 28 07/08/2014 1640   O2SAT 91.0 07/07/2014 2213   CBG (last 3)   Recent Labs  07/09/14 2354 07/10/14 0338 07/10/14 0715  GLUCAP 132* 128* 127*    Assessment/Plan: S/P Procedure(s) (LRB): CORONARY ARTERY BYPASS GRAFTING (CABG) times  four using right saphenous vein and left internal mammary artery. (N/A) INTRAOPERATIVE TRANSESOPHAGEAL ECHOCARDIOGRAM (N/A) POSSIBLE LEFT RADIAL ARTERY HARVEST (Left) Plan for transfer to step-down: see transfer orders   LOS: 5 days    VAN TRIGT III,Pura Picinich 07/10/2014

## 2014-07-11 ENCOUNTER — Inpatient Hospital Stay (HOSPITAL_COMMUNITY): Payer: BC Managed Care – PPO

## 2014-07-11 LAB — BASIC METABOLIC PANEL
Anion gap: 13 (ref 5–15)
BUN: 12 mg/dL (ref 6–23)
CO2: 30 mEq/L (ref 19–32)
Calcium: 8.7 mg/dL (ref 8.4–10.5)
Chloride: 93 mEq/L — ABNORMAL LOW (ref 96–112)
Creatinine, Ser: 0.68 mg/dL (ref 0.50–1.35)
GFR calc Af Amer: >90 mL/min (ref >90)
GFR calc non Af Amer: >90 mL/min (ref >90)
Glucose, Bld: 118 mg/dL — ABNORMAL HIGH (ref 70–99)
Potassium: 3.9 mEq/L (ref 3.7–5.3)
Sodium: 136 mEq/L — ABNORMAL LOW (ref 137–147)

## 2014-07-11 LAB — CBC
HCT: 33.8 % — ABNORMAL LOW (ref 39.0–52.0)
Hemoglobin: 11.3 g/dL — ABNORMAL LOW (ref 13.0–17.0)
MCH: 30.9 pg (ref 26.0–34.0)
MCHC: 33.4 g/dL (ref 30.0–36.0)
MCV: 92.3 fL (ref 78.0–100.0)
Platelets: 228 10*3/uL (ref 150–400)
RBC: 3.66 MIL/uL — ABNORMAL LOW (ref 4.22–5.81)
RDW: 12.8 % (ref 11.5–15.5)
WBC: 13.1 10*3/uL — ABNORMAL HIGH (ref 4.0–10.5)

## 2014-07-11 LAB — GLUCOSE, CAPILLARY
GLUCOSE-CAPILLARY: 120 mg/dL — AB (ref 70–99)
GLUCOSE-CAPILLARY: 119 mg/dL — AB (ref 70–99)
GLUCOSE-CAPILLARY: 129 mg/dL — AB (ref 70–99)
Glucose-Capillary: 114 mg/dL — ABNORMAL HIGH (ref 70–99)

## 2014-07-11 MED ORDER — METFORMIN HCL 500 MG PO TABS
1000.0000 mg | ORAL_TABLET | Freq: Two times a day (BID) | ORAL | Status: DC
  Administered 2014-07-12 – 2014-07-13 (×3): 1000 mg via ORAL
  Filled 2014-07-11 (×6): qty 2

## 2014-07-11 NOTE — Progress Notes (Signed)
CARDIAC REHAB PHASE I   PRE:  Rate/Rhythm: 95 SR    BP: sitting 107/67    SaO2: 91 2L  MODE:  Ambulation: 890 ft   POST:  Rate/Rhythm: 114 ST    BP: sitting 138/91     SaO2: 96 2L, 96 RA after 3 min   Pt moving well, stood independently. Used RW and 2L O2 to walk, supervision. Long distance, only c/o is hiccups. Took O2 off after walk and is staying up after 3-4 min. Notified RN. Put RW in room. [phone]  Elissa LovettReeve, Marybella Ethier AltaKristan CES, ACSM 07/11/2014 10:39 AM

## 2014-07-11 NOTE — Progress Notes (Signed)
[address]       [place] [id number]             [phone]          4 Days Post-Op Procedure(s) (LRB): CORONARY ARTERY BYPASS GRAFTING (CABG) times four using right saphenous vein and left internal mammary artery. (N/A) INTRAOPERATIVE TRANSESOPHAGEAL ECHOCARDIOGRAM (N/A) POSSIBLE LEFT RADIAL ARTERY HARVEST (Left)  Subjective: Had some nausea last night but feels a little better today.  Not eating well.  Just had a BM.  +productive cough.   Objective: Vital signs in last 24 hours: Patient Vitals for the past 24 hrs:  BP Temp Temp src Pulse Resp SpO2 Height Weight  07/11/14 0401 112/62 mmHg 97.8 F (36.6 C) Oral (!) 105 18 94 % - -  07/11/14 0201 - - - - - - 6' (1.829 m) 275 lb (124.739 kg)  07/10/14 2056 119/73 mmHg 98 F (36.7 C) Oral (!) 105 18 93 % - -  07/10/14 1556 116/76 mmHg 97.5 F (36.4 C) Oral (!) 104 18 94 % - -  07/10/14 1500 99/62 mmHg - - (!) 107 17 92 % - -  07/10/14 1400 129/68 mmHg - - (!) 113 15 95 % - -  07/10/14 1300 127/67 mmHg - - (!) 108 20 95 % - -  07/10/14 1200 133/60 mmHg 98.1 F (36.7 C) Oral (!) 112 (!) 22 92 % - -  07/10/14 1100 116/79 mmHg - - (!) 112 (!) 25 92 % - -  07/10/14 1000 123/77 mmHg - - (!) 110 (!) 22 92 % - -  07/10/14 0900 124/77 mmHg - - (!) 111 20 95 % - -   Current Weight  07/11/14 275 lb (124.739 kg)  PRE-OPERATIVE WEIGHT: 125 kg   Intake/Output from previous day: 11/22 0701 - 11/23 0700 In: 360 [P.O.:240; I.V.:120] Out: 1500 [Urine:1500] CBGs 159-133-118-120    PHYSICAL EXAM:  Heart: RRR, mildly tachy around 100 Lungs: Exp wheezes bilaterally Wound: Clean and dry Extremities: No significant LE edema    Lab Results: CBC: Recent Labs  07/10/14 0427 07/11/14 0431  WBC 14.8* 13.1*  HGB 11.4* 11.3*  HCT 33.3* 33.8*  PLT 150 228   BMET:  Recent Labs  07/10/14 0427 07/11/14 0431  NA 137 136*  K 3.9 3.9  CL 95* 93*  CO2 27 30  GLUCOSE 118* 118*  BUN 10 12  CREATININE  0.60 0.68  CALCIUM 8.6 8.7    PT/INR: No results for input(s): LABPROT, INR in the last 72 hours.   CXR: FINDINGS: Mediastinum and hilar structures are normal. Prior CABG. Mild cardiomegaly. No pulmonary venous distention. Mild left perihilar infiltrate and subsegmental atelectasis left lower lobe. Follow-up chest x-rays to demonstrate clearing suggested. Tiny right pleural effusion cannot be excluded. No pneumothorax. No acute bony abnormality.  IMPRESSION: 1. Mild left perihilar infiltrate and left lung base subsegmental atelectasis . Follow-up chest x-rays to demonstrate clearing suggested. 2. Small right pleural effusion cannot be excluded. 3. Prior CABG. Cardiomegaly. No pulmonary venous distention.   Assessment/Plan: S/P Procedure(s) (LRB): CORONARY ARTERY BYPASS GRAFTING (CABG) times four using right saphenous vein and left internal mammary artery. (N/A) INTRAOPERATIVE TRANSESOPHAGEAL ECHOCARDIOGRAM (N/A)  CV-  Still mildly tachy, but HRs a little better.  BPs on low side, so will not titrate beta blocker any more for now. Continue Lopressor, Dig.  Vol overload- continue diuresis.  Pulm- Continue pulm toilet, IS/FV, nebs.   CBGs generally stable  on Levemir.  Will likely need po meds for home with A1C-7.3.  Continue CRPI, ambulation.  Disp- [place]  He has been staying in a hotel and will need assistance if he has to stay in town post-d/c.    LOS: 6 days    Abb Gobert H 07/11/2014

## 2014-07-11 NOTE — Progress Notes (Signed)
Pt transferred to 2w19, pt oriented to room and call bell, assessment unchanged Archie BalboaStein, Jacobus Colvin G, RN

## 2014-07-12 DIAGNOSIS — I10 Essential (primary) hypertension: Secondary | ICD-10-CM

## 2014-07-12 DIAGNOSIS — Z951 Presence of aortocoronary bypass graft: Secondary | ICD-10-CM

## 2014-07-12 LAB — GLUCOSE, CAPILLARY
GLUCOSE-CAPILLARY: 103 mg/dL — AB (ref 70–99)
Glucose-Capillary: 104 mg/dL — ABNORMAL HIGH (ref 70–99)
Glucose-Capillary: 100 mg/dL — ABNORMAL HIGH (ref 70–99)
Glucose-Capillary: 92 mg/dL (ref 70–99)

## 2014-07-12 MED ORDER — TIOTROPIUM BROMIDE MONOHYDRATE 18 MCG IN CAPS
18.0000 ug | ORAL_CAPSULE | Freq: Every day | RESPIRATORY_TRACT | Status: DC
  Administered 2014-07-12 – 2014-07-13 (×2): 18 ug via RESPIRATORY_TRACT
  Filled 2014-07-12: qty 5

## 2014-07-12 MED ORDER — FUROSEMIDE 40 MG PO TABS
40.0000 mg | ORAL_TABLET | Freq: Two times a day (BID) | ORAL | Status: DC
  Administered 2014-07-12 – 2014-07-13 (×3): 40 mg via ORAL
  Filled 2014-07-12 (×5): qty 1

## 2014-07-12 MED ORDER — LISINOPRIL 5 MG PO TABS
5.0000 mg | ORAL_TABLET | Freq: Every day | ORAL | Status: DC
  Administered 2014-07-12 – 2014-07-13 (×2): 5 mg via ORAL
  Filled 2014-07-12 (×2): qty 1

## 2014-07-12 NOTE — Progress Notes (Signed)
EPW removed per order and protocal, wires required second pull by Delice Bisonara RN, wires intact upon removal, pt made aware of bedrest for 1 hour and stated understanding, will continue to monitor closely Antonio Cordova, Donterius Filley G, RN

## 2014-07-12 NOTE — Progress Notes (Signed)
PT ambulated 500 feet on room air with no assistance Archie BalboaStein, Arush Gatliff G, RN

## 2014-07-12 NOTE — Discharge Instructions (Signed)
Endoscopic Saphenous Vein Harvesting °Care After °Refer to this sheet in the next few weeks. These instructions provide you with information on caring for yourself after your procedure. Your health care provider may also give you more specific instructions. Your treatment has been planned according to current medical practices, but problems sometimes occur. Call your health care provider if you have any problems or questions after your procedure. °HOME CARE INSTRUCTIONS °Medicine °· Take whatever pain medicine your surgeon prescribes. Follow the directions carefully. Do not take over-the-counter pain medicine unless your surgeon says it is okay. Some pain medicine can cause bleeding problems for several weeks after surgery. °· Follow your surgeon's instructions about driving. You will probably not be permitted to drive after heart surgery. °· Take any medicines your surgeon prescribes. Any medicines you took before your heart surgery should be checked with your health care provider before you start taking them again. °Wound care °· If your surgeon has prescribed an elastic bandage or stocking, ask how long you should wear it. °· Check the area around your surgical cuts (incisions) whenever your bandages (dressings) are changed. Look for any redness or swelling. °· You will need to return to have the stitches (sutures) or staples taken out. Ask your surgeon when to do that. °· Ask your surgeon when you can shower or bathe. °Activity °· Try to keep your legs raised when you are sitting. °· Do any exercises your health care providers have given you. These may include deep breathing exercises, coughing, walking, or other exercises. °SEEK MEDICAL CARE IF: °· You have any questions about your medicines. °· You have more leg pain, especially if your pain medicine stops working. °· New or growing bruises develop on your leg. °· Your leg swells, feels tight, or becomes red. °· You have numbness in your leg. °SEEK IMMEDIATE  MEDICAL CARE IF: °· Your pain gets much worse. °· Blood or fluid leaks from any of the incisions. °· Your incisions become warm, swollen, or red. °· You have chest pain. °· You have trouble breathing. °· You have a fever. °· You have more pain near your leg incision. °MAKE SURE YOU: °· Understand these instructions. °· Will watch your condition. °· Will get help right away if you are not doing well or get worse. °Document Released: 04/17/2011 Document Revised: 08/10/2013 Document Reviewed: 04/17/2011 °ExitCare® Patient Information ©2015 [employer]. This information is not intended to replace advice given to you by your health care provider. Make sure you discuss any questions you have with your health care provider. °Coronary Artery Bypass Grafting, Care After °These instructions give you information on caring for yourself after your procedure. Your doctor may also give you more specific instructions. Call your doctor if you have any problems or questions after your procedure.  °HOME CARE °· Only take medicine as told by your doctor. Take medicines exactly as told. Do not stop taking medicines or start any new medicines without talking to your doctor first. °· Take your pulse as told by your doctor. °· Do deep breathing as told by your doctor. Use your breathing device (incentive spirometer), if given, to practice deep breathing several times a day. Support your chest with a pillow or your arms when you take deep breaths or cough. °· Keep the area clean, dry, and protected where the surgery cuts (incisions) were made. Remove bandages (dressings) only as told by your doctor. If strips were applied to surgical area, do not take them off. They fall off   on their own. °· Check the surgery area daily for puffiness (swelling), redness, or leaking fluid. °· If surgery cuts were made in your legs: °· Avoid crossing your legs. °· Avoid sitting for long periods of time. Change positions every 30 minutes. °· Raise your legs  when you are sitting. Place them on pillows. °· Wear stockings that help keep blood clots from forming in your legs (compression stockings). °· Only take sponge baths until your doctor says it is okay to take showers. Pat the surgery area dry. Do not rub the surgery area with a washcloth or towel. Do not bathe, swim, or use a hot tub until your doctor says it is okay. °· Eat foods that are high in fiber. These include raw fruits and vegetables, whole grains, beans, and nuts. Choose lean meats. Avoid canned, processed, and fried foods. °· Drink enough fluids to keep your pee (urine) clear or pale yellow. °· Weigh yourself every day. °· Rest and limit activity as told by your doctor. You may be told to: °· Stop any activity if you have chest pain, shortness of breath, changes in heartbeat, or dizziness. Get help right away if this happens. °· Move around often for short amounts of time or take short walks as told by your doctor. Gradually become more active. You may need help to strengthen your muscles and build endurance. °· Avoid lifting, pushing, or pulling anything heavier than 10 pounds (4.5 kg) for at least 6 weeks after surgery. °· Do not drive until your doctor says it is okay. °· Ask your doctor when you can go back to work. °· Ask your doctor when you can begin sexual activity again. °· Follow up with your doctor as told. °GET HELP IF: °· You have puffiness, redness, more pain, or fluid draining from the incision site. °· You have a fever. °· You have puffiness in your ankles or legs. °· You have pain in your legs. °· You gain 2 or more pounds (0.9 kg) a day. °· You feel sick to your stomach (nauseous) or throw up (vomit). °· You have watery poop (diarrhea). °GET HELP RIGHT AWAY IF: °· You have chest pain that goes to your jaw or arms. °· You have shortness of breath. °· You have a fast or irregular heartbeat. °· You notice a "clicking" in your breastbone when you move. °· You have numbness or weakness in  your arms or legs. °· You feel dizzy or light-headed. °MAKE SURE YOU: °· Understand these instructions. °· Will watch your condition. °· Will get help right away if you are not doing well or get worse. °Document Released: 08/10/2013 Document Reviewed: 08/10/2013 °ExitCare® Patient Information ©2015 [employer]. This information is not intended to replace advice given to you by your health care provider. Make sure you discuss any questions you have with your health care provider. ° °

## 2014-07-12 NOTE — Progress Notes (Signed)
Patient Name: Antonio MatesWilliam Cordova Date of Encounter: 07/12/2014  Active Problems:   Unstable angina   Coronary artery disease involving native coronary artery of native heart without angina pectoris   S/P CABG x 4   Length of Stay: 7  SUBJECTIVE  The patient feels well, he started ambulating and did well. He has some residual SOB.  CURRENT MEDS . acetaminophen  1,000 mg Oral 4 times per day   Or  . acetaminophen (TYLENOL) oral liquid 160 mg/5 mL  1,000 mg Per Tube 4 times per day  . aspirin EC  325 mg Oral Daily   Or  . aspirin  324 mg Per Tube Daily  . atorvastatin  80 mg Oral q1800  . bisacodyl  10 mg Oral Daily   Or  . bisacodyl  10 mg Rectal Daily  . digoxin  0.25 mg Oral Daily  . docusate sodium  200 mg Oral Daily  . enoxaparin (LOVENOX) injection  40 mg Subcutaneous QHS  . furosemide  40 mg Oral BID  . insulin aspart  0-24 Units Subcutaneous TID AC & HS  . lactulose  30 g Oral Daily  . lisinopril  5 mg Oral Daily  . metFORMIN  1,000 mg Oral BID WC  . metoprolol tartrate  25 mg Oral BID  . nicotine  21 mg Transdermal Daily  . pantoprazole  40 mg Oral Daily  . potassium chloride  20 mEq Oral Daily  . sodium chloride  3 mL Intravenous Q12H  . sodium chloride  3 mL Intravenous Q12H    OBJECTIVE  Filed Vitals:   07/12/14 0915 07/12/14 0930 07/12/14 0945 07/12/14 1001  BP: 133/74 137/78 130/67 128/68  Pulse: 90 81 79 87  Temp:      TempSrc:      Resp:      Height:      Weight:      SpO2:        Intake/Output Summary (Last 24 hours) at 07/12/14 1006 Last data filed at 07/12/14 0400  Gross per 24 hour  Intake    600 ml  Output    100 ml  Net    500 ml   Filed Weights   07/10/14 0700 07/11/14 0201 07/12/14 0508  Weight: 281 lb 8.4 oz (127.7 kg) 275 lb (124.739 kg) 272 lb 7.8 oz (123.6 kg)    PHYSICAL EXAM  General: Pleasant, NAD. Neuro: Alert and oriented X 3. Moves all extremities spontaneously. Psych: Normal affect. HEENT:  Normal  Neck: Supple  without bruits or JVD. Lungs:  Resp regular and unlabored, wheezing expiratory B/L. Heart: RRR no s3, s4, or murmurs. Abdomen: Soft, non-tender, non-distended, BS + x 4.  Extremities: No clubbing, cyanosis or edema. DP/PT/Radials 2+ and equal bilaterally.  Accessory Clinical Findings  CBC  Recent Labs  07/10/14 0427 07/11/14 0431  WBC 14.8* 13.1*  HGB 11.4* 11.3*  HCT 33.3* 33.8*  MCV 93.0 92.3  PLT 150 228   Basic Metabolic Panel  Recent Labs  07/10/14 0427 07/11/14 0431  NA 137 136*  K 3.9 3.9  CL 95* 93*  CO2 27 30  GLUCOSE 118* 118*  BUN 10 12  CREATININE 0.60 0.68  CALCIUM 8.6 8.7   Radiology/Studies  Dg Chest 2 View  07/11/2014   CLINICAL DATA:  Shortness of breath.    IMPRESSION: 1. Mild left perihilar infiltrate and left lung base subsegmental atelectasis . Follow-up chest x-rays to demonstrate clearing suggested. 2. Small right pleural effusion  cannot be excluded. 3. Prior CABG.  Cardiomegaly.  No pulmonary venous distention.     TELE: SR    ASSESSMENT AND PLAN  53 year old   1. CAD, s/p CABG x 4 on 07/07/14.  The patient is from [place] and wants to follow with a local cardiologist after discharge.  Continue ASA, metoprolol, high dose atorvastatin, lisinopril - ambulating and doing ok, still requiring low dose O2, possible discharge tomorrow  2. Mild acute distaolic CHF - agree with lasix  3. COPD - lifelong smoker, wheezing today, I would add Spiriva to his regimen   Signed, Lars MassonNELSON, Gaylin Osoria H MD, [facility] 07/12/2014

## 2014-07-12 NOTE — Progress Notes (Signed)
Pt ambulated with front wheeled walker on 2L oxygen 300 ft in hallway with RN.  Pt Oxygen saturation 96 on 2L before walk and 97 on 2L after walk.  Pt resting in chair without complaints, call light in reach.  RN will continue to monitor.   Thane EduKimberly Keion Neels, RN

## 2014-07-12 NOTE — Progress Notes (Addendum)
[address]       [place] [id number]             [phone]      5 Days Post-Op Procedure(s) (LRB): CORONARY ARTERY BYPASS GRAFTING (CABG) times four using right saphenous vein and left internal mammary artery. (N/A) INTRAOPERATIVE TRANSESOPHAGEAL ECHOCARDIOGRAM (N/A) POSSIBLE LEFT RADIAL ARTERY HARVEST (Left) Subjective: conts to progress pretty well  Objective: Vital signs in last 24 hours: Temp:  [97.9 F (36.6 C)-98.4 F (36.9 C)] 98.1 F (36.7 C) (11/24 0508) Pulse Rate:  [90-107] 102 (11/24 0816) Cardiac Rhythm:  [-] Sinus tachycardia;Normal sinus rhythm (11/24 0755) Resp:  [18-20] 18 (11/24 0508) BP: (107-122)/(53-67) 122/65 mmHg (11/24 0816) SpO2:  [95 %-99 %] 98 % (11/24 0816) Weight:  [272 lb 7.8 oz (123.6 kg)] 272 lb 7.8 oz (123.6 kg) (11/24 0508)  Hemodynamic parameters for last 24 hours:    Intake/Output from previous day: 11/23 0701 - 11/24 0700 In: 840 [P.O.:840] Out: 100 [Urine:100] Intake/Output this shift:    General appearance: alert, cooperative and no distress Heart: regular rate and rhythm Lungs: mildly dim in the bases Abdomen: benign Extremities: + LE edema Wound: incis healing well  Lab Results:  Recent Labs  07/10/14 0427 07/11/14 0431  WBC 14.8* 13.1*  HGB 11.4* 11.3*  HCT 33.3* 33.8*  PLT 150 228   BMET:  Recent Labs  07/10/14 0427 07/11/14 0431  NA 137 136*  K 3.9 3.9  CL 95* 93*  CO2 27 30  GLUCOSE 118* 118*  BUN 10 12  CREATININE 0.60 0.68  CALCIUM 8.6 8.7    PT/INR: No results for input(s): LABPROT, INR in the last 72 hours. ABG    Component Value Date/Time   PHART 7.348* 07/07/2014 2213   HCO3 26.4* 07/07/2014 2213   TCO2 28 07/08/2014 1640   O2SAT 91.0 07/07/2014 2213   CBG (last 3)   Recent Labs  07/11/14 1621 07/11/14 2201 07/12/14 0650  GLUCAP 129* 114* 104*    Meds Scheduled Meds: . acetaminophen  1,000 mg Oral 4 times per day   Or  . acetaminophen (TYLENOL) oral  liquid 160 mg/5 mL  1,000 mg Per Tube 4 times per day  . aspirin EC  325 mg Oral Daily   Or  . aspirin  324 mg Per Tube Daily  . atorvastatin  80 mg Oral q1800  . bisacodyl  10 mg Oral Daily   Or  . bisacodyl  10 mg Rectal Daily  . digoxin  0.25 mg Oral Daily  . docusate sodium  200 mg Oral Daily  . enoxaparin (LOVENOX) injection  40 mg Subcutaneous QHS  . furosemide  40 mg Oral Daily  . insulin aspart  0-24 Units Subcutaneous TID AC & HS  . lactulose  30 g Oral Daily  . metFORMIN  1,000 mg Oral BID WC  . metoprolol tartrate  25 mg Oral BID  . nicotine  21 mg Transdermal Daily  . pantoprazole  40 mg Oral Daily  . potassium chloride  20 mEq Oral Daily  . sodium chloride  3 mL Intravenous Q12H  . sodium chloride  3 mL Intravenous Q12H   Continuous Infusions: . sodium chloride Stopped (07/10/14 1400)   PRN Meds:.sodium chloride, levalbuterol, magnesium hydroxide, metoprolol, ondansetron (ZOFRAN) IV, oxyCODONE, sodium chloride, sodium chloride, traMADol  Xrays Dg Chest 2 View  07/11/2014   CLINICAL DATA:  Shortness of breath.  EXAM: CHEST  2 VIEW  COMPARISON:  07/10/2014.  FINDINGS: Mediastinum and hilar structures are normal. Prior CABG. Mild cardiomegaly. No pulmonary venous distention. Mild left perihilar infiltrate and subsegmental atelectasis left lower lobe. Follow-up chest x-rays to demonstrate clearing suggested. Tiny right pleural effusion cannot be excluded. No pneumothorax. No acute bony abnormality.  IMPRESSION: 1. Mild left perihilar infiltrate and left lung base subsegmental atelectasis . Follow-up chest x-rays to demonstrate clearing suggested. 2. Small right pleural effusion cannot be excluded. 3. Prior CABG.  Cardiomegaly.  No pulmonary venous distention.   Electronically Signed   By: Maisie Fushomas  Register   On: 07/11/2014 07:59    Assessment/Plan: S/P Procedure(s) (LRB): CORONARY ARTERY BYPASS GRAFTING (CABG) times four using right saphenous vein and left internal mammary  artery. (N/A) INTRAOPERATIVE TRANSESOPHAGEAL ECHOCARDIOGRAM (N/A) POSSIBLE LEFT RADIAL ARTERY HARVEST (Left)  1 steady overall progress  2 sugars ok on metformin 3 start low dose ace 4 give extra dose lasix today for volume overload 5 wean O2 6 plans to stay in local hotel with wife at discharge for a few weeks 7 d/c wires 8 poss discharge in am  LOS: 7 days    GOLD,WAYNE E 07/12/2014  Patient seen and examined, agree with above

## 2014-07-12 NOTE — Discharge Summary (Signed)
Physician Discharge Summary  Patient ID: Antonio MatesWilliam Cordova MRN: [id number] DOB/AGE: 08/29/1960 53 y.o.  Admit date: 07/05/2014 Discharge date: 07/12/2014  Admission Diagnoses: Unstable angina                    Non-Q-wave MI Discharge Diagnoses:  Active Problems:   Unstable angina   Coronary artery disease involving native coronary artery of native heart without angina pectoris   S/P CABG x 4  Type 2 diabetes mellitus Hyperlipidemia  History of present illness:  The patient is a 53 year old man with type II DM and history of tobacco abuse who was woken from sleep on 07/04/2014 with substernal chest pain. He described this as a sharp pain like being stabbed. He also came short of breath. He contacted EMS and was brought to the emergency department. His initial ECG showed diffuse ST depressions in inferior leads and leads V3-V6. Findings also were notable for an old anteroseptal myocardial infarction. He was started on intravenous heparin and nitroglycerin and was also given morphine. His ST depressions improved. His initial troponin was 0.01 but subsequently he did rule in for MI with a troponin of 19. Chest x-ray showed evidence of pulmonary edema. He was admitted for further evaluation and treatment to include cardiac catheterization.  Past Medical History  Diagnosis Date  . Diabetes mellitus without complication   . Hyperlipidemia     History reviewed. No pertinent past surgical history.  History reviewed. No pertinent family history.- No family history of CAD, + DM M and MGM  Social History:  reports that he has been smoking. He has never used smokeless tobacco. He reports that he drinks alcohol. His drug history is not on file.  Allergies: No Known Allergies  Medications:  Prior to Admission:  Prescriptions prior to admission  Medication Sig Dispense Refill Last Dose  . Acetaminophen (TYLENOL PO) Take 2 tablets by mouth daily as needed (pain).   Past  Week at Unknown time     Discharged Condition: good  Hospital Course: The patient was admitted and medically stabilized. He was taken to the cardiac catheterization lab on 07/05/2014 by Peter SwazilandJordan M.D. He was found to have severe three-vessel coronary artery disease with moderate LV dysfunction. Due to these findings cardiothoracic surgical consultation was obtained with Charlett LangoSteven Khylin Gutridge M.D. Dr. Dorris FetchHendrickson evaluated the patient and his studies and agreed with recommendations to proceed with coronary artery surgical revascularization. The patient remained clinically stable and on 07/07/2014 he was taken to the operating room at which time he underwent the following procedure:  DATE OF PROCEDURE: 07/07/2014 DATE OF DISCHARGE:   OPERATIVE REPORT   PREOPERATIVE DIAGNOSIS: Severe three-vessel coronary artery disease, status post non-Q-wave MI.  POSTOPERATIVE DIAGNOSIS: Severe three-vessel coronary artery disease, status post non-Q-wave MI.  PROCEDURE: Median sternotomy, extracorporeal circulation, coronary artery bypass grafting x4 (left internal mammary artery to left anterior descending, saphenous vein graft to second diagonal, saphenous vein graft to ramus intermedius, saphenous vein graft to posterior descending), endoscopic vein harvest right leg.  SURGEON: Salvatore DecentSteven C. Dorris FetchHendrickson, MD  FIRST ASSISTANT: Coral CeoGina Collins, PA-C  ANESTHESIA: General.  FINDINGS: Morbidly obese, good quality conduits, cardiomegaly, mild aortic insufficiency by TEE, diffusely diseased coronaries, ramus intermedius intramyocardial. The patient was transported from the operating room to the surgical intensive care unit in good condition.   Postoperative hospital course:  Overall the patient has progressed nicely. He was weaned from the ventilator without difficulty using standard protocols. He has remained neurologically intact. He does have some mild  postoperative volume overload but has responded well to diuretics. He is require aggressive pulmonary toilet. Oxygen has been weaned and he maintains adequate saturations on room air. His blood sugars have been somewhat elevated and he's been treated for standard manner protocols with transition to sliding scale and Glucophage. Hemoglobin A1c is 7.3 and is felt he will require oral medications at time of discharge, specifically Glucophage. He was not on this previously. He is tolerating gradually increasing activities using standard cardiac rehabilitation protocols. Incisions are noted be healing well without evidence of infection. He is tolerating diet. Of note the [place] is from [place] and he will be staying here in town for the additional couple weeks for recovery in a hotel. His wife for be with him. We will arrange early appointments for follow-up as we can return to [place] as soon as is feasible. Currently his status is felt to be tentatively stable for discharge in the next 24-48 hours pending ongoing reevaluation of his recovery.    Discharge Exam: Blood pressure 128/68, pulse 87, temperature 98.1 F (36.7 C), temperature source Oral, resp. rate 18, height 6' (1.829 m), weight 272 lb 7.8 oz (123.6 kg), SpO2 94 %. General appearance: alert, cooperative and no distress Heart: regular rate and rhythm Lungs: min dim in the left base Abdomen: benign Extremities: + LE edema Wound: incis healing well Disposition:    Medications at time of discharge:    Medication List    STOP taking these medications        TYLENOL PO      TAKE these medications        aspirin 325 MG EC tablet  Take 1 tablet (325 mg total) by mouth daily.     atorvastatin 80 MG tablet  Commonly known as:  LIPITOR  Take 1 tablet (80 mg total) by mouth daily at 6 PM.     digoxin 0.25 MG tablet  Commonly known as:  LANOXIN  Take 1 tablet (0.25 mg total) by mouth daily.     furosemide 40 MG tablet   Commonly known as:  LASIX  Take 1 tablet (40 mg total) by mouth daily.     lisinopril 10 MG tablet  Commonly known as:  PRINIVIL,ZESTRIL  Take 1 tablet (10 mg total) by mouth daily.     metFORMIN 1000 MG tablet  Commonly known as:  GLUCOPHAGE  Take 1 tablet (1,000 mg total) by mouth 2 (two) times daily with a meal.     metoprolol tartrate 25 MG tablet  Commonly known as:  LOPRESSOR  Take 1 tablet (25 mg total) by mouth 2 (two) times daily.     nicotine 21 mg/24hr patch  Commonly known as:  NICODERM CQ - dosed in mg/24 hours  Place 1 patch (21 mg total) onto the skin daily.     oxyCODONE 5 MG immediate release tablet  Commonly known as:  Oxy IR/ROXICODONE  Take 1-2 tablets (5-10 mg total) by mouth every 4 (four) hours as needed for severe pain.     potassium chloride SA 20 MEQ tablet  Commonly known as:  K-DUR,KLOR-CON  Take 1 tablet (20 mEq total) by mouth daily.     tiotropium 18 MCG inhalation capsule  Commonly known as:  SPIRIVA  Place 1 capsule (18 mcg total) into inhaler and inhale daily.         Follow-up Information    Follow up with Loreli SlotHENDRICKSON,Kiwan Gadsden C, MD.   Specialty:  Cardiothoracic Surgery   Why:  12/1/ 2015 at 9:45 am to see the surgeon. Please obtain a chest x-ray at [facility] 1 hour prior to this appointment. [facility] is located in the same office complex.   Contact information:   [address] [employer] [address] [place] [address] [phone]       Follow up with Tereso Newcomer, PA-C.   Specialty:  Physician Assistant   Why:  07/21/14 at 8:30 am for cardiology follow-up   Contact information:   [address] [place] [address] [phone]      The patient has been discharged on:   1.Beta Blocker:  Yes [ y  ]                              No   [   ]                              If No, reason:  2.Ace Inhibitor/ARB: Yes Cove.Etienne   ]                                     No  [    ]                                      If No, reason:  3.Statin:   Yes Cove.Etienne   ]                  No  [   ]                  If No, reason:  4.Ecasa:  Yes  [ y  ]                  No   [   ]                  If No, reason:  Signed: GOLD,WAYNE E 07/12/2014, 1:59 PM

## 2014-07-12 NOTE — Progress Notes (Signed)
CARDIAC REHAB PHASE I   PRE:  Rate/Rhythm: 89 SR  BP:  Supine:   Sitting: 120/60  Standing:    SaO2: 91-93 RA  MODE:  Ambulation: 890 ft   POST:  Rate/Rhythm: 91   BP:  Supine:   Sitting: 130/70  Standing:    SaO2: 94 RA 1140-1210 On arrival pt in recliner. Assisted X 1 and used walker to ambulate. Gait steady with walker. Pt able to walk 890 feet with a couple of stanidig rest stops. RA sat in hall 94-95% and on return to room 94%. Pt back to recliner after walk with call light in reach. I encourged pt to watch recovery from heart surgery video on TV when his wife comes today.  Melina CopaLisa Tabathia Knoche RN 07/12/2014 12:07 PM

## 2014-07-13 ENCOUNTER — Inpatient Hospital Stay (HOSPITAL_COMMUNITY): Payer: BC Managed Care – PPO

## 2014-07-13 LAB — GLUCOSE, CAPILLARY
GLUCOSE-CAPILLARY: 96 mg/dL (ref 70–99)
GLUCOSE-CAPILLARY: 92 mg/dL (ref 70–99)

## 2014-07-13 MED ORDER — FUROSEMIDE 40 MG PO TABS: 40.0000 mg | ORAL_TABLET | Freq: Every day | ORAL | Status: DC

## 2014-07-13 MED ORDER — NICOTINE 21 MG/24HR TD PT24: 21.0000 mg | MEDICATED_PATCH | Freq: Every day | TRANSDERMAL | Status: AC

## 2014-07-13 MED ORDER — METFORMIN HCL 1000 MG PO TABS: 1000.0000 mg | ORAL_TABLET | Freq: Two times a day (BID) | ORAL | Status: AC

## 2014-07-13 MED ORDER — METOPROLOL TARTRATE 25 MG PO TABS: 25.0000 mg | ORAL_TABLET | Freq: Two times a day (BID) | ORAL | Status: AC

## 2014-07-13 MED ORDER — OXYCODONE HCL 5 MG PO TABS: 5.0000 mg | ORAL_TABLET | ORAL | Status: DC | PRN

## 2014-07-13 MED ORDER — TIOTROPIUM BROMIDE MONOHYDRATE 18 MCG IN CAPS: 18.0000 ug | ORAL_CAPSULE | Freq: Every day | RESPIRATORY_TRACT | Status: AC

## 2014-07-13 MED ORDER — DIGOXIN 250 MCG PO TABS: 0.2500 mg | ORAL_TABLET | Freq: Every day | ORAL | Status: DC

## 2014-07-13 MED ORDER — ASPIRIN 325 MG PO TBEC: 325.0000 mg | DELAYED_RELEASE_TABLET | Freq: Every day | ORAL | Status: DC

## 2014-07-13 MED ORDER — LISINOPRIL 10 MG PO TABS: 10.0000 mg | ORAL_TABLET | Freq: Every day | ORAL | Status: DC

## 2014-07-13 MED ORDER — ATORVASTATIN CALCIUM 80 MG PO TABS: 80.0000 mg | ORAL_TABLET | Freq: Every day | ORAL | Status: AC

## 2014-07-13 MED ORDER — POTASSIUM CHLORIDE CRYS ER 20 MEQ PO TBCR: 20.0000 meq | EXTENDED_RELEASE_TABLET | Freq: Every day | ORAL | Status: DC

## 2014-07-13 NOTE — Progress Notes (Signed)
[id number] Education completed with pt and wife who voiced understanding. Encouraged IS and flutter valve. Discussed carb  watching and heart healthy eating. Encouraged pt to attend CRP 2  in [place] once he gets cardiologist established. Has watched discharge video. Luetta NuttingCharlene Cyndee Giammarco RN BSN 07/13/2014 10:59 AM

## 2014-07-13 NOTE — Plan of Care (Signed)
Problem: Consults Goal: Chest Pain Patient Education (See Patient Education module for education specifics.) Outcome: Progressing     

## 2014-07-13 NOTE — Progress Notes (Signed)
[address]       [place] [id number]             [phone]      6 Days Post-Op Procedure(s) (LRB): CORONARY ARTERY BYPASS GRAFTING (CABG) times four using right saphenous vein and left internal mammary artery. (N/A) INTRAOPERATIVE TRANSESOPHAGEAL ECHOCARDIOGRAM (N/A) POSSIBLE LEFT RADIAL ARTERY HARVEST (Left) Subjective: Feels fine Has arrangements at local hotel to stay in town  Objective: Vital signs in last 24 hours: Temp:  [97.6 F (36.4 C)-98.7 F (37.1 C)] 98.7 F (37.1 C) (11/25 0300) Pulse Rate:  [79-102] 90 (11/25 0300) Cardiac Rhythm:  [-] Normal sinus rhythm (11/24 2030) Resp:  [16-18] 16 (11/25 0300) BP: (100-140)/(55-78) 117/65 mmHg (11/25 0300) SpO2:  [92 %-98 %] 92 % (11/25 0300) Weight:  [271 lb 2.7 oz (123 kg)] 271 lb 2.7 oz (123 kg) (11/25 0300)  Hemodynamic parameters for last 24 hours:    Intake/Output from previous day: 11/24 0701 - 11/25 0700 In: 840 [P.O.:840] Out: -  Intake/Output this shift:    General appearance: alert, cooperative and no distress Heart: regular rate and rhythm Lungs: min dim in the left base Abdomen: benign Extremities: + LE edema Wound: incis healing well  Lab Results:  Recent Labs  07/11/14 0431  WBC 13.1*  HGB 11.3*  HCT 33.8*  PLT 228   BMET:  Recent Labs  07/11/14 0431  NA 136*  K 3.9  CL 93*  CO2 30  GLUCOSE 118*  BUN 12  CREATININE 0.68  CALCIUM 8.7    PT/INR: No results for input(s): LABPROT, INR in the last 72 hours. ABG    Component Value Date/Time   PHART 7.348* 07/07/2014 2213   HCO3 26.4* 07/07/2014 2213   TCO2 28 07/08/2014 1640   O2SAT 91.0 07/07/2014 2213   CBG (last 3)   Recent Labs  07/12/14 1630 07/12/14 2122 07/13/14 0608  GLUCAP 92 103* 96    Meds Scheduled Meds: . aspirin EC  325 mg Oral Daily   Or  . aspirin  324 mg Per Tube Daily  . atorvastatin  80 mg Oral q1800  . bisacodyl  10 mg Oral Daily   Or  . bisacodyl  10 mg Rectal  Daily  . digoxin  0.25 mg Oral Daily  . docusate sodium  200 mg Oral Daily  . enoxaparin (LOVENOX) injection  40 mg Subcutaneous QHS  . furosemide  40 mg Oral BID  . insulin aspart  0-24 Units Subcutaneous TID AC & HS  . lactulose  30 g Oral Daily  . lisinopril  5 mg Oral Daily  . metFORMIN  1,000 mg Oral BID WC  . metoprolol tartrate  25 mg Oral BID  . nicotine  21 mg Transdermal Daily  . pantoprazole  40 mg Oral Daily  . potassium chloride  20 mEq Oral Daily  . sodium chloride  3 mL Intravenous Q12H  . sodium chloride  3 mL Intravenous Q12H  . tiotropium  18 mcg Inhalation Daily   Continuous Infusions: . sodium chloride Stopped (07/10/14 1400)   PRN Meds:.sodium chloride, levalbuterol, magnesium hydroxide, metoprolol, ondansetron (ZOFRAN) IV, oxyCODONE, sodium chloride, sodium chloride, traMADol  Xrays Dg Chest 2 View  07/13/2014   CLINICAL DATA:  Status post CABG  EXAM: CHEST  2 VIEW  COMPARISON:  Portable chest x-ray of November 23,2015.  FINDINGS: The lungs are better inflated today. The left hemidiaphragm remains elevated. There is left perihilar and basilar  atelectasis. The heart and pulmonary vascularity are normal. There are small bilateral pleural effusions layering posteriorly. There are 6 intact sternal wires.  IMPRESSION: There is improved aeration of both lungs. There is persistent left perihilar and basilar atelectasis, persistent small bilateral pleural effusions, and elevated left hemidiaphragm.   Electronically Signed   By: David  SwazilandJordan   On: 07/13/2014 06:56    Assessment/Plan: S/P Procedure(s) (LRB): CORONARY ARTERY BYPASS GRAFTING (CABG) times four using right saphenous vein and left internal mammary artery. (N/A) INTRAOPERATIVE TRANSESOPHAGEAL ECHOCARDIOGRAM (N/A) POSSIBLE LEFT RADIAL ARTERY HARVEST (Left)  1 doing well 2 stable for discharge   LOS: 8 days    Antonio Cordova E 07/13/2014

## 2014-07-13 NOTE — Progress Notes (Signed)
Patient D/C'd to home with wife. D/C instructions reviewed with patient and wife and prescriptions handed to wife.  IV and telemetry D/C'd/ Patient transported to pickup via wheelchair by volunteer.

## 2014-07-19 ENCOUNTER — Encounter: Payer: Self-pay | Admitting: *Deleted

## 2014-07-19 ENCOUNTER — Ambulatory Visit (INDEPENDENT_AMBULATORY_CARE_PROVIDER_SITE_OTHER): Payer: Self-pay | Admitting: Thoracic Surgery (Cardiothoracic Vascular Surgery)

## 2014-07-19 ENCOUNTER — Encounter: Payer: Self-pay | Admitting: Thoracic Surgery (Cardiothoracic Vascular Surgery)

## 2014-07-19 ENCOUNTER — Other Ambulatory Visit: Payer: Self-pay | Admitting: Thoracic Surgery (Cardiothoracic Vascular Surgery)

## 2014-07-19 ENCOUNTER — Ambulatory Visit
Admission: RE | Admit: 2014-07-19 | Discharge: 2014-07-19 | Disposition: A | Discharge status: Home / Self Care | Payer: BC Managed Care – PPO | Source: Ambulatory Visit | Attending: Thoracic Surgery (Cardiothoracic Vascular Surgery) | Admitting: Thoracic Surgery (Cardiothoracic Vascular Surgery)

## 2014-07-19 VITALS — BP 91/55 | HR 71 | Resp 20 | Ht 72.0 in | Wt 271.0 lb

## 2014-07-19 DIAGNOSIS — Z951 Presence of aortocoronary bypass graft: Secondary | ICD-10-CM

## 2014-07-19 DIAGNOSIS — I2510 Atherosclerotic heart disease of native coronary artery without angina pectoris: Secondary | ICD-10-CM

## 2014-07-19 MED ORDER — CEPHALEXIN 500 MG PO CAPS: 500.0000 mg | ORAL_CAPSULE | Freq: Three times a day (TID) | ORAL | Status: AC

## 2014-07-19 MED ORDER — OXYCODONE HCL 5 MG PO TABS: 5.0000 mg | ORAL_TABLET | ORAL | Status: AC | PRN

## 2014-07-19 NOTE — Progress Notes (Signed)
HPI:  [name] returns today for scheduled follow-up visit.  He is a 53 year old man from [place] who was working in [place] recently. He presented with a non-Q-wave MI and had severe three-vessel coronary disease. He had coronary bypass grafting 4 on November 19. His postoperative course was unremarkable. He had transient atrial fib which was treated with digoxin. He was in sinus rhythm at time of discharge. He has wife is been staying in a hotel locally awaiting approval to return home to [place].  He does have some incisional pain. He is taking one pain pill about every 5-6 hours. He is not having any problems with shortness of breath. His wife did notice some redness at the lower part of the sternal incision. He has not had any drainage. He does not feel any clicking or popping. He says he has not smoked since discharge. He complains of some numbness along the volar aspect of his right forearm. He does not have any numbness in his right hand.  Past Medical History  Diagnosis Date  . Diabetes mellitus without complication   . Hyperlipidemia    COPD Non-Q-wave MI Three-vessel coronary disease    Current Outpatient Prescriptions  Medication Sig Dispense Refill  . aspirin EC 325 MG EC tablet Take 1 tablet (325 mg total) by mouth daily.    Marland Kitchen. atorvastatin (LIPITOR) 80 MG tablet Take 1 tablet (80 mg total) by mouth daily at 6 PM. 30 tablet 1  . digoxin (LANOXIN) 0.25 MG tablet Take 1 tablet (0.25 mg total) by mouth daily. 30 tablet 1  . lisinopril (PRINIVIL,ZESTRIL) 10 MG tablet Take 1 tablet (10 mg total) by mouth daily. 30 tablet 1  . metFORMIN (GLUCOPHAGE) 1000 MG tablet Take 1 tablet (1,000 mg total) by mouth 2 (two) times daily with a meal. 60 tablet 1  . metoprolol tartrate (LOPRESSOR) 25 MG tablet Take 1 tablet (25 mg total) by mouth 2 (two) times daily. 60 tablet 1  . nicotine (NICODERM CQ - DOSED IN MG/24 HOURS) 21 mg/24hr patch Place 1 patch (21 mg total) onto the  skin daily. 28 patch 0  . oxyCODONE (OXY IR/ROXICODONE) 5 MG immediate release tablet Take 1-2 tablets (5-10 mg total) by mouth every 4 (four) hours as needed for severe pain. 50 tablet 0  . tiotropium (SPIRIVA) 18 MCG inhalation capsule Place 1 capsule (18 mcg total) into inhaler and inhale daily. 30 capsule 1  . cephALEXin (KEFLEX) 500 MG capsule Take 1 capsule (500 mg total) by mouth 3 (three) times daily. 21 capsule 0   No current facility-administered medications for this visit.    Physical Exam BP 91/55 mmHg  Pulse 71  Resp 20  Ht 6' (1.829 m)  Wt 271 lb (122.925 kg)  BMI 36.75 kg/m2  SpO2 4392% Obese 53 year old man in no acute distress Alert and oriented 3 with no focal deficits Cardiac regular rate and rhythm normal S1 and S2 Lungs diminished at bases, left greater than right Sternum stable, incision clean dry and intact, erythema at the lower portion of the wound Leg incisions healing well, trace peripheral edema  Diagnostic Tests: CHEST 2 VIEW  COMPARISON: 07/13/2014.  FINDINGS: Mediastinum structures normal. Cardiomegaly. Prior CABG. Left base atelectasis and/or infiltrate again noted. Elevated left hemidiaphragm again noted. Left pleural effusion cannot be excluded. No acute bony abnormality.  IMPRESSION: 1. Left lower lobe atelectasis and or infiltrate. Cyst is small left pleural effusion cannot be excluded .  2. Stable elevation left hemidiaphragm .  Electronically Signed  By: Maisie Fushomas Register  On: 07/19/2014 09:28  Impression: 53 year old man who is status post coronary bypass grafting 4 for three-vessel disease following a non-Q-wave MI. He is progressing well at this time. I think he is safe to travel back to [place]. I did recommend that they stop at least every hour get out and walk around for 10-15 minutes.  He may begin driving after 4 weeks if he is no longer having to take narcotics during the day. He is not to lift anything over 10  pounds for at least 6 weeks. I estimate he will be able to return to work in about 3 months the time of surgery.  There are a couple of issues. He does have some erythema over the lower aspect of the sternal incision. This is right where the chest and abdomen and may be due to motion in that area. However to be on the safe side I am going to give him a prescription for Keflex 500 mg by mouth 3 times a day for 1 week.   His left hemidiaphragm is a little elevated. 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 There is about a 90% chance this will resolve over time.  I did give him a prescription for an additional 50 oxycodone tablets, 5 mg, 1-2 tablets 4 times daily as needed for pain.   He says he has not smoked since he left the hospital. The importance of abstinence from tobacco was stressed. The importance of diabetes management was emphasized as well.  Plan:  He will follow-up with Dr. Dorothe Peaouglas Beckwith in [place]  His wife requested a referral to Dr. Ricke [place]Zacharias in [place] for cardiology follow-up. My office will request a referral.

## 2014-07-21 ENCOUNTER — Encounter: Payer: Self-pay | Admitting: Physician Assistant

## 2014-07-21 ENCOUNTER — Ambulatory Visit (INDEPENDENT_AMBULATORY_CARE_PROVIDER_SITE_OTHER): Payer: BC Managed Care – PPO | Admitting: Physician Assistant

## 2014-07-21 VITALS — BP 90/50 | HR 75 | Ht 72.0 in | Wt 274.0 lb

## 2014-07-21 DIAGNOSIS — E785 Hyperlipidemia, unspecified: Secondary | ICD-10-CM

## 2014-07-21 DIAGNOSIS — I2510 Atherosclerotic heart disease of native coronary artery without angina pectoris: Secondary | ICD-10-CM

## 2014-07-21 DIAGNOSIS — E119 Type 2 diabetes mellitus without complications: Secondary | ICD-10-CM

## 2014-07-21 DIAGNOSIS — J449 Chronic obstructive pulmonary disease, unspecified: Secondary | ICD-10-CM

## 2014-07-21 DIAGNOSIS — Z720 Tobacco use: Secondary | ICD-10-CM

## 2014-07-21 MED ORDER — ASPIRIN 81 MG PO TBEC: 81.0000 mg | DELAYED_RELEASE_TABLET | Freq: Every day | ORAL | Status: AC

## 2014-07-21 MED ORDER — NITROGLYCERIN 0.4 MG SL SUBL: 0.4000 mg | SUBLINGUAL_TABLET | SUBLINGUAL | Status: AC | PRN

## 2014-07-21 MED ORDER — DIGOXIN 250 MCG PO TABS: 0.1250 mg | ORAL_TABLET | Freq: Every day | ORAL | Status: AC

## 2014-07-21 MED ORDER — LISINOPRIL 10 MG PO TABS: 5.0000 mg | ORAL_TABLET | Freq: Every day | ORAL | Status: AC

## 2014-07-21 NOTE — Patient Instructions (Signed)
RX FOR NITROGLYCERIN HAS BEEN SENT IN TODAY   DECREASE LISINOPRIL TO  5 MG DAILY DECREASE DIGOXIN 0.125 MG DAILY DECREASE ASPRIN TO 81 MG DAILY  YOU WILL NEED TO FOLLOW UP WITH A NEW CARDIOLOGIST IN 6-8 WEEKS  ONCE YOU ESTABLISH WITH A CARDIOLOGIST MAKE SURE TO DISCUSS ABOUT IF YOU NEED TO CONTINUE DIGOXIN. IF YOU NEED REFILLS BEFORE YOU SEE YOUR NEW CARDIOLOGIST LET US KNOW SO THAT WE MAY SEND THEM IN FOR YOU

## 2014-07-21 NOTE — Progress Notes (Signed)
Cardiology Office Note   Date:  07/21/2014   ID:  Antonio MatesWilliam Speights, DOB 1961/07/16, MRN [id number]  PCP:  No PCP Per Patient  Cardiologist:  Dr. Peter SwazilandJordan >> Dr. Dorothea [place]manZachiarias in [place]    History of Present Illness: Antonio Cordova is a 53 y.o. male with a history of DM2, tobacco abuse. He was admitted 11/17-11/24 with a non-STEMI complicated by acute CHF.  Cardiac catheterization demonstrated 3 vessel CAD and mild LV dysfunction with an EF of 45%. Follow-up echocardiogram demonstrated normal LV function with an EF of 50-55%. Patient was referred to [facility]. CABG was recommended. This was performed by Dr. Dorris FetchHendrickson (LIMA-LAD, SVG-D2, SVG-RI, SVG-PDA).  He required postoperative diuretics for volume excess. Spiriva was added for COPD. He remained in NSR.    He returns for FU.  He is doing well since DC.  Chest soreness is improved.  He is walking every day.  He denies significant dyspnea.  He denies orthopnea, PND, syncope or near syncope.  He notes his LE edema continues to improve. The R leg remains > the L leg.  He has quit smoking.  His sugars are running 90-130s.     Studies:   - LHC (07/05/14):  dLM 30-40%, pLAD 90%, D2 90%, pRI 80%, mRI 70%, ostial CFX occluded (L-L and R-L collats), pRCA 80%, mRCA 70%, EF 45%, mid ant HK  - Echo (07/05/14):  EF 50-55%, Gr 1 DD, no RWMA, mild AI  - Carotid US (07/05/14):  Bilateral ICA 1-39%.   Recent Labs: 07/05/2014: LDL (calc) 111*; Pro B Natriuretic peptide (BNP) 68.5; TSH 0.720 07/07/2014: ALT 29 07/11/2014: BUN 12; Creatinine 0.68; Hemoglobin 11.3*; Potassium 3.9; Sodium 136*    Recent Radiology: Dg Chest 2 View  07/19/2014    IMPRESSION: 1. Left lower lobe atelectasis and or infiltrate. Cyst is small left pleural effusion cannot be excluded .  2.  Stable elevation left hemidiaphragm .   Electronically Signed   By: Maisie Fushomas  Register   On: 07/19/2014 09:28     Wt Readings from Last 3 Encounters:  07/21/14 274 lb (124.286 kg)  07/19/14  271 lb (122.925 kg)  07/13/14 271 lb 2.7 oz (123 kg)     Past Medical History  Diagnosis Date  . Diabetes mellitus without complication   . Hyperlipidemia     Current Outpatient Prescriptions  Medication Sig Dispense Refill  . aspirin EC 325 MG EC tablet Take 1 tablet (325 mg total) by mouth daily.    Marland Kitchen. atorvastatin (LIPITOR) 80 MG tablet Take 1 tablet (80 mg total) by mouth daily at 6 PM. 30 tablet 1  . cephALEXin (KEFLEX) 500 MG capsule Take 1 capsule (500 mg total) by mouth 3 (three) times daily. 21 capsule 0  . digoxin (LANOXIN) 0.25 MG tablet Take 1 tablet (0.25 mg total) by mouth daily. 30 tablet 1  . lisinopril (PRINIVIL,ZESTRIL) 10 MG tablet Take 1 tablet (10 mg total) by mouth daily. 30 tablet 1  . metFORMIN (GLUCOPHAGE) 1000 MG tablet Take 1 tablet (1,000 mg total) by mouth 2 (two) times daily with a meal. 60 tablet 1  . metoprolol tartrate (LOPRESSOR) 25 MG tablet Take 1 tablet (25 mg total) by mouth 2 (two) times daily. 60 tablet 1  . nicotine (NICODERM CQ - DOSED IN MG/24 HOURS) 21 mg/24hr patch Place 1 patch (21 mg total) onto the skin daily. 28 patch 0  . oxyCODONE (OXY IR/ROXICODONE) 5 MG immediate release tablet Take 1-2 tablets (5-10 mg total) by mouth  every 4 (four) hours as needed for severe pain. 50 tablet 0  . tiotropium (SPIRIVA) 18 MCG inhalation capsule Place 1 capsule (18 mcg total) into inhaler and inhale daily. 30 capsule 1   No current facility-administered medications for this visit.     Allergies:   Review of patient's allergies indicates no known allergies.   Social History:  The patient  reports that he has quit smoking. He started smoking about 2 weeks ago. He has never used smokeless tobacco. He reports that he drinks alcohol.   Family History:  The patient's family history includes Heart attack in his maternal grandfather; Hypertension in his maternal grandfather, maternal grandmother, and mother. There is no history of Stroke.    ROS:  Please see  the history of present illness.       All other systems reviewed and negative.    PHYSICAL EXAM: VS:  BP 90/50 mmHg  Pulse 75  Ht 6' (1.829 m)  Wt 274 lb (124.286 kg)  BMI 37.15 kg/m2 General:  NAD Neck:  No JVD Chest:  median sternotomy well healed with very slight erythema at the distal edge of the incision Cardiac:  RRR, no murmur Lungs:  decreased breath sounds bilaterally, no wheezing or rales. Abdomen:  Soft, NT, no hepatomegaly Extremities:  1+ bilateral LE edema (R>L).  EKG:  NSR, HR 75, normal axis, TWI V1-4      ASSESSMENT AND PLAN:  1.  Coronary Artery Disease:  Doing well after recent NSTEMI followed by CABG.  He has already FU with the surgeon.  He is eager to return home which is near [place].  He has family member that already sees a Cardiologist there (Dr. Dorothea GlassmanZachiarias).  He plans to establish with him.  Of note, review of the records indicates fairly significant tachycardia post op.  He was placed on a combination of Digoxin and Metoprolol for this.  HR is now much better.  His EF improved to low normal by echo prior to DC.  BPs have tended to run low.    -  Decrease Digoxin to 0.125 mg QD.  Consider DC'ing at his next visit with his new Cardiologist in [place].    -  He can decrease ASA to 81 mg QD.    -  Continue beta blocker, statin.    -  Decrease Lisinopril to 5 mg QD.    -  Give Rx for prn NTG. 2.  Hyperlipidemia:  Continue statin.  He will need Lipids and LFTs at FU with his new cardiologist. 3.  Tobacco Abuse:  He has quit smoking. 4.  COPD:  Continue inhaler.  FU with PCP. 5.  Diabetes Mellitus:  Good control since DC.  FU with PCP.  Disposition:  He plans to return to home near [place] today.  I have asked him to FU with cardiology in 6-8 weeks.     Signed, Brynda RimScott Drema Eddington, PA-C, MHS 07/21/2014 9:03 AM    [facility] [address], [place], [place]  [address] Phone: [phone]; Fax: [phone]

## 2014-07-28 ENCOUNTER — Encounter (HOSPITAL_COMMUNITY): Payer: Self-pay | Admitting: Cardiology

## 2015-03-10 ENCOUNTER — Other Ambulatory Visit: Payer: Self-pay | Admitting: Surgical

## 2015-08-03 IMAGING — CR DG CHEST 1V PORT
1 series · 1 of 1 positions shown · non-contrast
Comparison: 07/07/2014

CLINICAL DATA: Post CABG procedure.

EXAM:
PORTABLE CHEST - 1 VIEW

[AP]
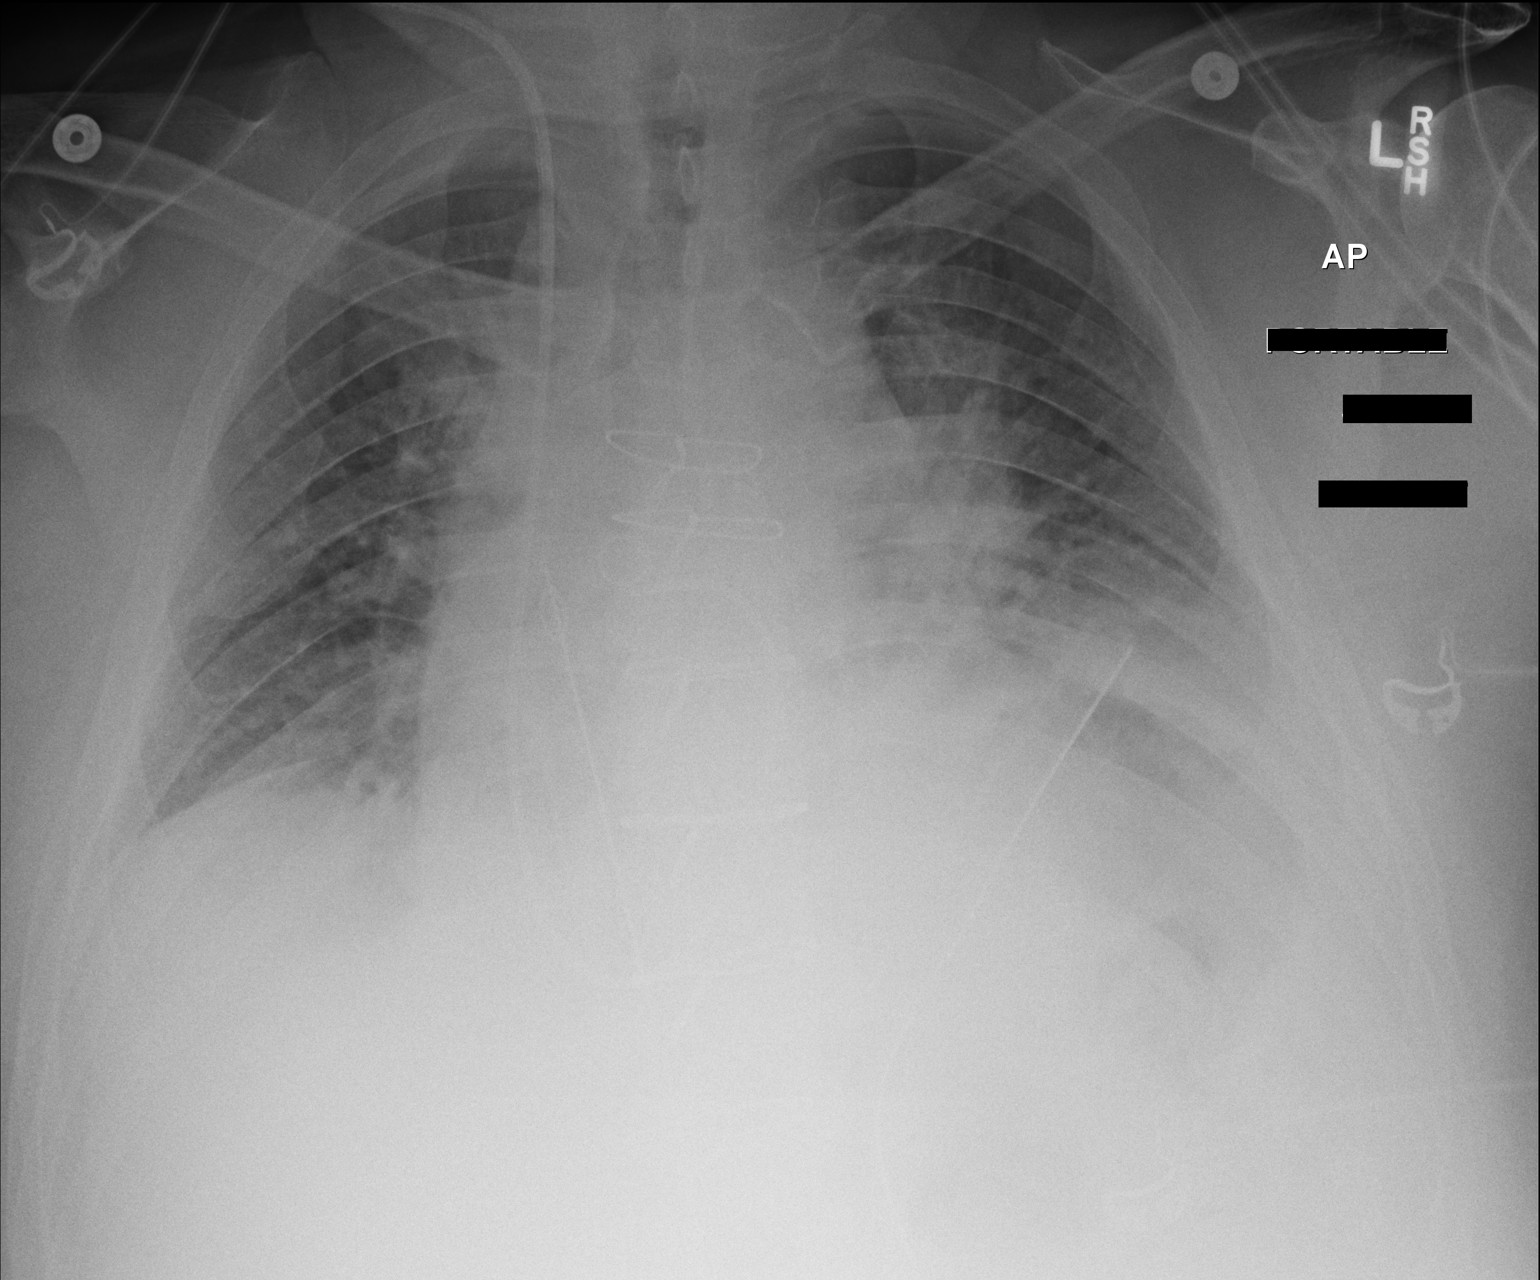

[1 of 1 positions shown; findings below may reference images not displayed]

FINDINGS: The endotracheal tube and nasogastric tube have been removed.
Swan-Ganz catheter tip in the right pulmonary artery. Bilateral
chest drains are still present. Low lung volumes with prominent
central vascular structures. Negative for a pneumothorax.
IMPRESSION: Decreased lung volumes following removal of the endotracheal tube.
Difficult to exclude vascular congestion due to the low lung
volumes.

Chest drains without definite pneumothorax.

## 2018-10-16 ENCOUNTER — Encounter
Admit: 2018-10-16 | Discharge: 2018-10-16 | Payer: PRIVATE HEALTH INSURANCE | Attending: Family Medicine | Primary: Family

## 2019-04-23 NOTE — Telephone Encounter (Signed)
[employer] with refills

## 2019-04-29 ENCOUNTER — Encounter: Payer: PRIVATE HEALTH INSURANCE | Attending: Family | Primary: Family

## 2019-04-29 ENCOUNTER — Encounter: Attending: Family | Primary: Family

## 2019-04-29 ENCOUNTER — Telehealth: Admit: 2019-04-29 | Discharge: 2019-04-29 | Payer: BLUE CROSS/BLUE SHIELD | Attending: Family | Primary: Family

## 2019-04-29 ENCOUNTER — Telehealth: Attending: Family | Primary: Family

## 2019-04-29 DIAGNOSIS — I10 Essential (primary) hypertension: Secondary | ICD-10-CM

## 2019-04-29 MED ORDER — ATORVASTATIN 80 MG TAB
80 mg | ORAL_TABLET | ORAL | 1 refills | Status: DC
Start: 2019-04-29 — End: 2019-12-13

## 2019-04-29 MED ORDER — METFORMIN 1,000 MG TAB
1000 mg | ORAL_TABLET | ORAL | 1 refills | Status: DC
Start: 2019-04-29 — End: 2019-12-13

## 2019-04-29 MED ORDER — METOPROLOL TARTRATE 25 MG TAB
25 mg | ORAL_TABLET | Freq: Two times a day (BID) | ORAL | 1 refills | Status: DC
Start: 2019-04-29 — End: 2019-12-13

## 2019-04-29 NOTE — Progress Notes (Signed)
HISTORY OF PRESENT ILLNESS  Jaime Nash is a 58 y.o. male presents via telemedicine for  htn (well maintained 120/80 generally .    Key Antihyperglycemic Medications             metFORMIN (GLUCOPHAGE) 1,000 mg tablet (Taking) TAKE 1 TABLET BY MOUTH TWICE DAILY      diabetes follow up no numbness in feet or extremities/does not check blood sugars but A1c's have been 6.2/6.1 regularly "I eat right"    Mixed hyperlipidemia follow up and med refill      There were no vitals filed for this visit.  Patient Active Problem List   Diagnosis Code   ??? Benign hypertension I10   ??? Benign prostatic hyperplasia N40.0   ??? Type II diabetes mellitus (HCC) E11.9   ??? Dyslipidemia E78.5   ??? Myocardial infarction [facility]) I21.9     Patient Active Problem List    Diagnosis Date Noted   ??? Benign hypertension 04/28/2019   ??? Benign prostatic hyperplasia 04/28/2019   ??? Type II diabetes mellitus ([place]) 04/28/2019   ??? Dyslipidemia 04/28/2019   ??? Myocardial infarction ([place]) 04/28/2019     Current Outpatient Medications   Medication Sig Dispense Refill   ??? atorvastatin (LIPITOR) 80 mg tablet TAKE 1 TABLET BY MOUTH AT BEDTIME     ??? metFORMIN (GLUCOPHAGE) 1,000 mg tablet TAKE 1 TABLET BY MOUTH TWICE DAILY     ??? metoprolol tartrate (LOPRESSOR) 25 mg tablet TAKE 1 TABLET BY MOUTH ONCE DAILY       No Known Allergies  Past Medical History:   Diagnosis Date   ??? Diabetes ([place])    ??? Heart attack [facility])      Past Surgical History:   Procedure Laterality Date   ??? CARDIAC SURG PROCEDURE UNLIST      Open heart surgery    ??? HX CARPAL TUNNEL RELEASE       Family History   Problem Relation Age of Onset   ??? Diabetes Mother    ??? Heart Disease Mother    ??? Heart Disease Father      Social History     Tobacco Use   ??? Smoking status: Former Smoker     Types: Cigarettes   ??? Smokeless tobacco: Never Used   Substance Use Topics   ??? Alcohol use: Not on file           Review of Systems   Constitutional: Negative for fever.   Respiratory: Negative for cough and shortness of  breath.    Cardiovascular: Negative for chest pain and palpitations.   Gastrointestinal: Negative for constipation and diarrhea.   Neurological: Negative for dizziness.   Psychiatric/Behavioral: Negative for depression. The patient is not nervous/anxious and does not have insomnia.        Physical Exam  Constitutional:       Appearance: Normal appearance. He is obese.   HENT:      Head: Normocephalic.   Eyes:      Extraocular Movements: Extraocular movements intact.   Neck:      Comments: Normal neck movement as seen on video chat  Pulmonary:      Effort: Pulmonary effort is normal.   Neurological:      General: No focal deficit present.      Mental Status: He is alert and oriented to person, place, and time.   Psychiatric:         Mood and Affect: Mood normal.  Behavior: Behavior normal.         Thought Content: Thought content normal.         Judgment: Judgment normal.           ASSESSMENT and PLAN  Diagnoses and all orders for this visit:    1. Benign hypertension  -     metoprolol tartrate (LOPRESSOR) 25 mg tablet; Take 1 Tab by mouth two (2) times a day.  Well  Maintained will refill for 6 months encouraged to take bp at least 2 x a week  2. Myocardial infarction, unspecified MI type, unspecified artery (HCC)  -     metoprolol tartrate (LOPRESSOR) 25 mg tablet; Take 1 Tab by mouth two (2) times a day.  Followed by cards.   3. Type 2 diabetes mellitus with other specified complication, without long-term current use of insulin (HCC)  -     metFORMIN (GLUCOPHAGE) 1,000 mg tablet; TAKE 1 TABLET BY MOUTH TWICE DAILY  Well maintained will refill for 6 months 6.2 last a1c sees optho regularly and checks feet encouraged to check blood sugars more often  4. Dyslipidemia  -     atorvastatin (LIPITOR) 80 mg tablet; TAKE 1 TABLET BY MOUTH AT BEDTIME  Check labs next visit last labs were really good       Rachael FeeWilliam R Nash who was evaluated through a synchronous (real-time) audio-video encounter, and/or his healthcare  decision maker, is aware that it is a billable service, with coverage as determined by his insurance carrier. He provided verbal consent to proceed: Yes, and patient identification was verified. It was conducted pursuant to the emergency declaration under the [employer] Act and the [employer] Emergencies Act, 1135 waiver authority and the [employer]onavirus Preparedness and [employer] Supplemental Appropriations Act. A caregiver was present when appropriate. Ability to conduct physical exam was limited. I was at home. The patient was at home.          Jonnie Finnerlaiborne T Perdue, NP

## 2019-04-29 NOTE — Progress Notes (Signed)
HISTORY OF PRESENT ILLNESS  Jaime Nash is a 58 y.o. male presents via telemedicine for  htn (well maintained 120/80 generally .    Key Antihyperglycemic Medications             metFORMIN (GLUCOPHAGE) 1,000 mg tablet (Taking) TAKE 1 TABLET BY MOUTH TWICE DAILY      diabetes follow up no numbness in feet or extremities/does not check blood sugars but A1c's have been 6.2/6.1 regularly "I eat right"    Mixed hyperlipidemia follow up and med refill      There were no vitals filed for this visit.  Patient Active Problem List   Diagnosis Code   ??? Benign hypertension I10   ??? Benign prostatic hyperplasia N40.0   ??? Type II diabetes mellitus (HCC) E11.9   ??? Dyslipidemia E78.5   ??? Myocardial infarction [facility]) I21.9     Patient Active Problem List    Diagnosis Date Noted   ??? Benign hypertension 04/28/2019   ??? Benign prostatic hyperplasia 04/28/2019   ??? Type II diabetes mellitus ([place]) 04/28/2019   ??? Dyslipidemia 04/28/2019   ??? Myocardial infarction ([place]) 04/28/2019     Current Outpatient Medications   Medication Sig Dispense Refill   ??? atorvastatin (LIPITOR) 80 mg tablet TAKE 1 TABLET BY MOUTH AT BEDTIME     ??? metFORMIN (GLUCOPHAGE) 1,000 mg tablet TAKE 1 TABLET BY MOUTH TWICE DAILY     ??? metoprolol tartrate (LOPRESSOR) 25 mg tablet TAKE 1 TABLET BY MOUTH ONCE DAILY       No Known Allergies  Past Medical History:   Diagnosis Date   ??? Diabetes ([place])    ??? Heart attack [facility])      Past Surgical History:   Procedure Laterality Date   ??? CARDIAC SURG PROCEDURE UNLIST      Open heart surgery    ??? HX CARPAL TUNNEL RELEASE       Family History   Problem Relation Age of Onset   ??? Diabetes Mother    ??? Heart Disease Mother    ??? Heart Disease Father      Social History     Tobacco Use   ??? Smoking status: Former Smoker     Types: Cigarettes   ??? Smokeless tobacco: Never Used   Substance Use Topics   ??? Alcohol use: Not on file           Review of Systems   Constitutional: Negative for fever.   Respiratory: Negative for cough and shortness of  breath.    Cardiovascular: Negative for chest pain and palpitations.   Gastrointestinal: Negative for constipation and diarrhea.   Neurological: Negative for dizziness.   Psychiatric/Behavioral: Negative for depression. The patient is not nervous/anxious and does not have insomnia.        Physical Exam  Constitutional:       Appearance: Normal appearance. He is obese.   HENT:      Head: Normocephalic.   Eyes:      Extraocular Movements: Extraocular movements intact.   Neck:      Comments: Normal neck movement as seen on video chat  Pulmonary:      Effort: Pulmonary effort is normal.   Neurological:      General: No focal deficit present.      Mental Status: He is alert and oriented to person, place, and time.   Psychiatric:         Mood and Affect: Mood normal.  Behavior: Behavior normal.         Thought Content: Thought content normal.         Judgment: Judgment normal.           ASSESSMENT and PLAN  Diagnoses and all orders for this visit:    1. Benign hypertension  -     metoprolol tartrate (LOPRESSOR) 25 mg tablet; Take 1 Tab by mouth two (2) times a day.  Well  Maintained will refill for 6 months encouraged to take bp at least 2 x a week  2. Myocardial infarction, unspecified MI type, unspecified artery (HCC)  -     metoprolol tartrate (LOPRESSOR) 25 mg tablet; Take 1 Tab by mouth two (2) times a day.  Followed by cards.   3. Type 2 diabetes mellitus with other specified complication, without long-term current use of insulin (HCC)  -     metFORMIN (GLUCOPHAGE) 1,000 mg tablet; TAKE 1 TABLET BY MOUTH TWICE DAILY  Well maintained will refill for 6 months 6.2 last a1c sees optho regularly and checks feet encouraged to check blood sugars more often  4. Dyslipidemia  -     atorvastatin (LIPITOR) 80 mg tablet; TAKE 1 TABLET BY MOUTH AT BEDTIME  Check labs next visit last labs were really good       Ozro R Crego who was evaluated through a synchronous (real-time) audio-video encounter, and/or his healthcare  decision maker, is aware that it is a billable service, with coverage as determined by his insurance carrier. He provided verbal consent to proceed: Yes, and patient identification was verified. It was conducted pursuant to the emergency declaration under the Stafford Act and the National Emergencies Act, 1135 waiver authority and the Coronavirus Preparedness and Response Supplemental Appropriations Act. A caregiver was present when appropriate. Ability to conduct physical exam was limited. I was at home. The patient was at home.          Terrie Haring T Perdue, NP

## 2019-04-29 NOTE — Patient Instructions (Signed)
High Blood Pressure: Care Instructions  Overview     It's normal for blood pressure to go up and down throughout the day. But if it stays up, you have high blood pressure. Another name for high blood pressure is hypertension.  Despite what a lot of people think, high blood pressure usually doesn't cause headaches or make you feel dizzy or lightheaded. It usually has no symptoms. But it does increase your risk of stroke, heart attack, and other problems. You and your doctor will talk about your risks of these problems based on your blood pressure.  Your doctor will give you a goal for your blood pressure. Your goal will be based on your health and your age.  Lifestyle changes, such as eating healthy and being active, are always important to help lower blood pressure. You might also take medicine to reach your blood pressure goal.  Follow-up care is a key part of your treatment and safety. Be sure to make and go to all appointments, and call your doctor if you are having problems. It's also a good idea to know your test results and keep a list of the medicines you take.  How can you care for yourself at home?  Medical treatment  ?? If you stop taking your medicine, your blood pressure will go back up. You may take one or more types of medicine to lower your blood pressure. Be safe with medicines. Take your medicine exactly as prescribed. Call your doctor if you think you are having a problem with your medicine.  ?? Talk to your doctor before you start taking aspirin every day. Aspirin can help certain people lower their risk of a heart attack or stroke. But taking aspirin isn't right for everyone, because it can cause serious bleeding.  ?? See your doctor regularly. You may need to see the doctor more often at first or until your blood pressure comes down.  ?? If you are taking blood pressure medicine, talk to your doctor before you take decongestants or anti-inflammatory medicine, such as ibuprofen. Some of these  medicines can raise blood pressure.  ?? Learn how to check your blood pressure at home.  Lifestyle changes  ?? Stay at a healthy weight. This is especially important if you put on weight around the waist. Losing even 10 pounds can help you lower your blood pressure.  ?? If your doctor recommends it, get more exercise. Walking is a good choice. Bit by bit, increase the amount you walk every day. Try for at least 30 minutes on most days of the week. You also may want to swim, bike, or do other activities.  ?? Avoid or limit alcohol. Talk to your doctor about whether you can drink any alcohol.  ?? Try to limit how much sodium you eat to less than 2,300 milligrams (mg) a day. Your doctor may ask you to try to eat less than 1,500 mg a day.  ?? Eat plenty of fruits (such as bananas and oranges), vegetables, legumes, whole grains, and low-fat dairy products.  ?? Lower the amount of saturated fat in your diet. Saturated fat is found in animal products such as milk, cheese, and meat. Limiting these foods may help you lose weight and also lower your risk for heart disease.  ?? Do not smoke. Smoking increases your risk for heart attack and stroke. If you need help quitting, talk to your doctor about stop-smoking programs and medicines. These can increase your chances of quitting for good.    When should you call for help?   Call  911 anytime you think you may need emergency care. This may mean having symptoms that suggest that your blood pressure is causing a serious heart or blood vessel problem. Your blood pressure may be over 180/120.  For example, call 911 if:  ?? ?? You have symptoms of a heart attack. These may include:  ? Chest pain or pressure, or a strange feeling in the chest.  ? Sweating.  ? Shortness of breath.  ? Nausea or vomiting.  ? Pain, pressure, or a strange feeling in the back, neck, jaw, or upper belly or in one or both shoulders or arms.  ? Lightheadedness or sudden weakness.  ? A fast or irregular heartbeat.   ?? ??  You have symptoms of a stroke. These may include:  ? Sudden numbness, tingling, weakness, or loss of movement in your face, arm, or leg, especially on only one side of your body.  ? Sudden vision changes.  ? Sudden trouble speaking.  ? Sudden confusion or trouble understanding simple statements.  ? Sudden problems with walking or balance.  ? A sudden, severe headache that is different from past headaches.   ?? ?? You have severe back or belly pain.   Do not wait until your blood pressure comes down on its own. Get help right away.  Call your doctor now or seek immediate care if:  ?? ?? Your blood pressure is much higher than normal (such as 180/120 or higher), but you don't have symptoms.   ?? ?? You think high blood pressure is causing symptoms, such as:  ? Severe headache.  ? Blurry vision.   Watch closely for changes in your health, and be sure to contact your doctor if:  ?? ?? Your blood pressure measures higher than your doctor recommends at least 2 times. That means the top number is higher or the bottom number is higher, or both.   ?? ?? You think you may be having side effects from your blood pressure medicine.   Where can you learn more?  Go to https://www.healthwise.net/[facility]  Enter X567 in the search box to learn more about "High Blood Pressure: Care Instructions."  Current as of: August 03, 2018??????????????????????????????Content Version: 12.6  ?? 2006-2020 Healthwise, Inc[employer].   Care instructions adapted under license by [employer] (which disclaims liability or warranty for this information). If you have questions about a medical condition or this instruction, always ask your healthcare professional. Healthwise, Incorporated disclaims any warranty or liability for your use of this information.

## 2019-12-13 ENCOUNTER — Ambulatory Visit: Admit: 2019-12-13 | Discharge: 2019-12-13 | Payer: BLUE CROSS/BLUE SHIELD | Attending: Family | Primary: Family

## 2019-12-13 ENCOUNTER — Ambulatory Visit: Attending: Family | Primary: Family

## 2019-12-13 DIAGNOSIS — E785 Hyperlipidemia, unspecified: Secondary | ICD-10-CM

## 2019-12-13 MED ORDER — ATORVASTATIN 80 MG TAB
80 mg | ORAL_TABLET | ORAL | 1 refills | Status: DC
Start: 2019-12-13 — End: 2020-07-17

## 2019-12-13 MED ORDER — METFORMIN 1,000 MG TAB
1000 mg | ORAL_TABLET | ORAL | 1 refills | Status: DC
Start: 2019-12-13 — End: 2020-07-17

## 2019-12-13 MED ORDER — ALBUTEROL SULFATE HFA 90 MCG/ACTUATION AEROSOL INHALER
90 mcg/actuation | Freq: Four times a day (QID) | RESPIRATORY_TRACT | 3 refills | Status: DC | PRN
Start: 2019-12-13 — End: 2020-07-17

## 2019-12-13 MED ORDER — BUDESONIDE-FORMOTEROL HFA 160 MCG-4.5 MCG/ACTUATION AEROSOL INHALER
Freq: Two times a day (BID) | RESPIRATORY_TRACT | 5 refills | Status: DC
Start: 2019-12-13 — End: 2020-07-17

## 2019-12-13 MED ORDER — METOPROLOL TARTRATE 25 MG TAB
25 mg | ORAL_TABLET | Freq: Two times a day (BID) | ORAL | 1 refills | Status: DC
Start: 2019-12-13 — End: 2020-07-17

## 2019-12-13 MED ORDER — PREDNISONE 20 MG TAB
20 mg | ORAL_TABLET | Freq: Every day | ORAL | 0 refills | Status: DC
Start: 2019-12-13 — End: 2020-01-10

## 2019-12-13 NOTE — Progress Notes (Signed)
Lab work is all normal.  A1c is 6.6%.   Rest of labs look good.     Can you please fax a copy of his labs to Dr. Garnett at [facility] Fax number [phone]. Thank you!

## 2019-12-13 NOTE — Progress Notes (Signed)
Lab work is all normal.  A1c is 6.6%.   Rest of labs look good.     Can you please fax a copy of his labs to Dr. Glendon Axe at [facility] Fax number [phone]. Thank you!

## 2019-12-13 NOTE — Progress Notes (Signed)
1. Have you been to the ER, urgent care clinic since your last visit?  Hospitalized since your last visit? No   2. Have you seen or consulted any other health care providers outside of the [facility] since your last visit?  Include any pap smears or colon screening. No  Visit Vitals  BP 108/74 (BP 1 Location: Right arm, BP Patient Position: At rest, BP Cuff Size: Adult)   Pulse 78   Temp 98.1 F (36.7 C) (Temporal)   Resp 18   Ht 6' (1.829 m)   Wt 280 lb 3.2 oz (127.1 kg)   SpO2 94%   BMI 38.00 kg/m     Chief Complaint   Patient presents with   . Follow Up Chronic Condition     Pt is asking for refills on metformin, atorvastatin, lopressor

## 2019-12-13 NOTE — Progress Notes (Signed)
Pt informed and faxed records

## 2019-12-13 NOTE — Progress Notes (Signed)
Left vm x1 and faxed

## 2019-12-13 NOTE — Progress Notes (Signed)
HISTORY OF PRESENT ILLNESS  Jaime Nash is a 59 y.o. male presents to the office for medication refill and lab work    1. Hypertension: Patients hypertension is well controlled on regimen of metoprolol. Denies headaches, blurred vision or dizziness.   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     2. Diabetes: Last A1c was 6.2% Diabetes well controlled on metformin. Patient's last eye exam was 2019. Denies neuropathy.    3. Hyperlipidemia: Mixed hyperlipidemia well controlled on atorvastain.  Denies any complications from medications.     4. Shoulder pain:  Patient reported that he has had right shoulder pain x 1 year.  Patient works on [employer] and feels that he overuses it.   Denies any known injury.    Patient notes burning in shoulder and stiffness in the morning.  Extending arm out causes pain.   Has not tried any treatments.       5. Wheezing:  Patient found to be wheezing on exam.  After discussing with patient he notes that he has been wheezing for months.  Has been told in the past that his "lungs were not in great shape"  No official COPD diagnosis however he smoked for 40 years.   Told he has chronic bronchitis in the past.     Vitals:    12/13/19 0645   BP: 108/74   Pulse: 78   Resp: 18   Temp: 98.1 ??F (36.7 ??C)   TempSrc: Temporal   SpO2: 94%   Weight: 280 lb 3.2 oz (127.1 kg)   Height: 6' (1.829 m)     Patient Active Problem List   Diagnosis Code   ??? Benign hypertension I10   ??? Benign prostatic hyperplasia N40.0   ??? Type II diabetes mellitus (HCC) E11.9   ??? Dyslipidemia E78.5   ??? Myocardial infarction (HCC) I21.9   ??? Impingement syndrome of right shoulder M75.41   ??? COPD with acute exacerbation (HCC) J44.1     Patient Active Problem List    Diagnosis Date Noted   ??? Impingement syndrome of right shoulder 12/13/2019   ??? COPD with acute exacerbation (HCC) 12/13/2019   ??? Benign hypertension 04/28/2019   ??? Benign prostatic hyperplasia 04/28/2019   ??? Type II  diabetes mellitus (HCC) 04/28/2019   ??? Dyslipidemia 04/28/2019   ??? Myocardial infarction (HCC) 04/28/2019     Current Outpatient Medications   Medication Sig Dispense Refill   ??? aspirin delayed-release 81 mg tablet Take 81 mg by mouth daily.     ??? atorvastatin (LIPITOR) 80 mg tablet TAKE 1 TABLET BY MOUTH AT BEDTIME 90 Tab 1   ??? metFORMIN (GLUCOPHAGE) 1,000 mg tablet TAKE 1 TABLET BY MOUTH TWICE DAILY 180 Tab 1   ??? metoprolol tartrate (LOPRESSOR) 25 mg tablet Take 1 Tab by mouth two (2) times a day. 180 Tab 1   ??? budesonide-formoteroL (Symbicort) 160-4.5 mcg/actuation HFAA Take 2 Puffs by inhalation two (2) times a day. 1 Inhaler 5   ??? albuterol (PROVENTIL HFA, VENTOLIN HFA, PROAIR HFA) 90 mcg/actuation inhaler Take 1 Puff by inhalation every six (6) hours as needed for Wheezing. 1 Inhaler 3   ??? predniSONE (DELTASONE) 20 mg tablet Take 60 mg by mouth daily (with breakfast). 15 Tab 0     No Known Allergies  Past Medical History:   Diagnosis Date   ??? Diabetes (HCC)    ??? Heart attack (HCC)  Past Surgical History:   Procedure Laterality Date   ??? HX CARPAL TUNNEL RELEASE     ??? PR CARDIAC SURG PROCEDURE UNLIST      Open heart surgery      Family History   Problem Relation Age of Onset   ??? Diabetes Mother    ??? Heart Disease Mother    ??? Heart Disease Father      Social History     Tobacco Use   ??? Smoking status: Former Smoker     Types: Cigarettes   ??? Smokeless tobacco: Never Used   Substance Use Topics   ??? Alcohol use: Yes     Comment: socially            Review of Systems   Constitutional: Negative for malaise/fatigue and weight loss.   Eyes: Negative for blurred vision, double vision and pain.   Respiratory: Positive for shortness of breath and wheezing. Negative for cough.    Cardiovascular: Negative for chest pain, palpitations, orthopnea and leg swelling.   Gastrointestinal: Negative for heartburn and nausea.   Musculoskeletal: Positive for joint pain (right shoulder). Negative for myalgias.   Skin: Negative for  itching and rash.   Neurological: Negative for dizziness, tingling, loss of consciousness, weakness and headaches.   Endo/Heme/Allergies: Does not bruise/bleed easily.   Psychiatric/Behavioral: Negative for depression. The patient is not nervous/anxious.        Physical Exam  Constitutional:       Appearance: Normal appearance. He is obese.   HENT:      Head: Normocephalic and atraumatic.   Eyes:      Extraocular Movements: Extraocular movements intact.      Conjunctiva/sclera: Conjunctivae normal.      Pupils: Pupils are equal, round, and reactive to light.   Cardiovascular:      Rate and Rhythm: Normal rate and regular rhythm.      Pulses: Normal pulses.   Pulmonary:      Effort: Pulmonary effort is normal.      Breath sounds: Wheezing present.   Musculoskeletal:      Right shoulder: He exhibits decreased range of motion (internal rotation and extension) and tenderness.   Skin:     General: Skin is warm and dry.   Neurological:      General: No focal deficit present.      Mental Status: He is alert and oriented to person, place, and time.   Psychiatric:         Mood and Affect: Mood normal.         Behavior: Behavior normal.           ASSESSMENT and PLAN    Diagnoses and all orders for this visit:    1. Dyslipidemia  Comments:  well controlled on statin.   Orders:  -     atorvastatin (LIPITOR) 80 mg tablet; TAKE 1 TABLET BY MOUTH AT BEDTIME  -     LIPID PANEL    2. Type 2 diabetes mellitus with other specified complication, without long-term current use of insulin (HCC)  Comments:  stable.   Due for eye exam, he will schedule.   Orders:  -     metFORMIN (GLUCOPHAGE) 1,000 mg tablet; TAKE 1 TABLET BY MOUTH TWICE DAILY  -     CBC WITH AUTOMATED DIFF  -     HEMOGLOBIN A1C WITH EAG  -     LIPID PANEL  -     METABOLIC PANEL, COMPREHENSIVE  -  MICROALBUMIN, UR, RAND W/ MICROALB/CREAT RATIO    3. Benign hypertension  Comments:  well controlled.   Orders:  -     metoprolol tartrate (LOPRESSOR) 25 mg tablet; Take 1 Tab  by mouth two (2) times a day.    4. Myocardial infarction, unspecified MI type, unspecified artery (HCC)  Comments:  follows with Dr. Roque Lias.   Orders:  -     metoprolol tartrate (LOPRESSOR) 25 mg tablet; Take 1 Tab by mouth two (2) times a day.    5. Impingement syndrome of right shoulder  Comments:  steroids to help with inflammation and pain.   Referral to ortho placed.   Orders:  -     REFERRAL TO ORTHOPEDICS  -     predniSONE (DELTASONE) 20 mg tablet; Take 60 mg by mouth daily (with breakfast).    6. COPD with acute exacerbation (HCC)  Comments:  restart on inhalers, daily and rescue.   Steroids to help with acute exacerbation.   Orders:  -     budesonide-formoteroL (Symbicort) 160-4.5 mcg/actuation HFAA; Take 2 Puffs by inhalation two (2) times a day.  -     albuterol (PROVENTIL HFA, VENTOLIN HFA, PROAIR HFA) 90 mcg/actuation inhaler; Take 1 Puff by inhalation every six (6) hours as needed for Wheezing.  -     predniSONE (DELTASONE) 20 mg tablet; Take 60 mg by mouth daily (with breakfast).         Charlesetta Shanks, NP

## 2019-12-13 NOTE — Progress Notes (Signed)
Pt informed and faxed records

## 2019-12-13 NOTE — Progress Notes (Signed)
HISTORY OF PRESENT ILLNESS  Jaime Nash is a 59 y.o. male presents to the office for medication refill and lab work    1. Hypertension: Patients hypertension is well controlled on regimen of metoprolol. Denies headaches, blurred vision or dizziness.   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     2. Diabetes: Last A1c was 6.2% Diabetes well controlled on metformin. Patient's last eye exam was 2019. Denies neuropathy.    3. Hyperlipidemia: Mixed hyperlipidemia well controlled on atorvastain.  Denies any complications from medications.     4. Shoulder pain:  Patient reported that he has had right shoulder pain x 1 year.  Patient works on [employer] and feels that he overuses it.   Denies any known injury.    Patient notes burning in shoulder and stiffness in the morning.  Extending arm out causes pain.   Has not tried any treatments.       5. Wheezing:  Patient found to be wheezing on exam.  After discussing with patient he notes that he has been wheezing for months.  Has been told in the past that his "lungs were not in great shape"  No official COPD diagnosis however he smoked for 40 years.   Told he has chronic bronchitis in the past.     Vitals:    12/13/19 0645   BP: 108/74   Pulse: 78   Resp: 18   Temp: 98.1 ??F (36.7 ??C)   TempSrc: Temporal   SpO2: 94%   Weight: 280 lb 3.2 oz (127.1 kg)   Height: 6' (1.829 m)     Patient Active Problem List   Diagnosis Code   ??? Benign hypertension I10   ??? Benign prostatic hyperplasia N40.0   ??? Type II diabetes mellitus (HCC) E11.9   ??? Dyslipidemia E78.5   ??? Myocardial infarction (HCC) I21.9   ??? Impingement syndrome of right shoulder M75.41   ??? COPD with acute exacerbation (HCC) J44.1     Patient Active Problem List    Diagnosis Date Noted   ??? Impingement syndrome of right shoulder 12/13/2019   ??? COPD with acute exacerbation (HCC) 12/13/2019   ??? Benign hypertension 04/28/2019   ??? Benign prostatic hyperplasia 04/28/2019   ??? Type II  diabetes mellitus (HCC) 04/28/2019   ??? Dyslipidemia 04/28/2019   ??? Myocardial infarction (HCC) 04/28/2019     Current Outpatient Medications   Medication Sig Dispense Refill   ??? aspirin delayed-release 81 mg tablet Take 81 mg by mouth daily.     ??? atorvastatin (LIPITOR) 80 mg tablet TAKE 1 TABLET BY MOUTH AT BEDTIME 90 Tab 1   ??? metFORMIN (GLUCOPHAGE) 1,000 mg tablet TAKE 1 TABLET BY MOUTH TWICE DAILY 180 Tab 1   ??? metoprolol tartrate (LOPRESSOR) 25 mg tablet Take 1 Tab by mouth two (2) times a day. 180 Tab 1   ??? budesonide-formoteroL (Symbicort) 160-4.5 mcg/actuation HFAA Take 2 Puffs by inhalation two (2) times a day. 1 Inhaler 5   ??? albuterol (PROVENTIL HFA, VENTOLIN HFA, PROAIR HFA) 90 mcg/actuation inhaler Take 1 Puff by inhalation every six (6) hours as needed for Wheezing. 1 Inhaler 3   ??? predniSONE (DELTASONE) 20 mg tablet Take 60 mg by mouth daily (with breakfast). 15 Tab 0     No Known Allergies  Past Medical History:   Diagnosis Date   ??? Diabetes (HCC)    ??? Heart attack (HCC)  Past Surgical History:   Procedure Laterality Date   ??? HX CARPAL TUNNEL RELEASE     ??? PR CARDIAC SURG PROCEDURE UNLIST      Open heart surgery      Family History   Problem Relation Age of Onset   ??? Diabetes Mother    ??? Heart Disease Mother    ??? Heart Disease Father      Social History     Tobacco Use   ??? Smoking status: Former Smoker     Types: Cigarettes   ??? Smokeless tobacco: Never Used   Substance Use Topics   ??? Alcohol use: Yes     Comment: socially            Review of Systems   Constitutional: Negative for malaise/fatigue and weight loss.   Eyes: Negative for blurred vision, double vision and pain.   Respiratory: Positive for shortness of breath and wheezing. Negative for cough.    Cardiovascular: Negative for chest pain, palpitations, orthopnea and leg swelling.   Gastrointestinal: Negative for heartburn and nausea.   Musculoskeletal: Positive for joint pain (right shoulder). Negative for myalgias.   Skin: Negative for  itching and rash.   Neurological: Negative for dizziness, tingling, loss of consciousness, weakness and headaches.   Endo/Heme/Allergies: Does not bruise/bleed easily.   Psychiatric/Behavioral: Negative for depression. The patient is not nervous/anxious.        Physical Exam  Constitutional:       Appearance: Normal appearance. He is obese.   HENT:      Head: Normocephalic and atraumatic.   Eyes:      Extraocular Movements: Extraocular movements intact.      Conjunctiva/sclera: Conjunctivae normal.      Pupils: Pupils are equal, round, and reactive to light.   Cardiovascular:      Rate and Rhythm: Normal rate and regular rhythm.      Pulses: Normal pulses.   Pulmonary:      Effort: Pulmonary effort is normal.      Breath sounds: Wheezing present.   Musculoskeletal:      Right shoulder: He exhibits decreased range of motion (internal rotation and extension) and tenderness.   Skin:     General: Skin is warm and dry.   Neurological:      General: No focal deficit present.      Mental Status: He is alert and oriented to person, place, and time.   Psychiatric:         Mood and Affect: Mood normal.         Behavior: Behavior normal.           ASSESSMENT and PLAN    Diagnoses and all orders for this visit:    1. Dyslipidemia  Comments:  well controlled on statin.   Orders:  -     atorvastatin (LIPITOR) 80 mg tablet; TAKE 1 TABLET BY MOUTH AT BEDTIME  -     LIPID PANEL    2. Type 2 diabetes mellitus with other specified complication, without long-term current use of insulin (HCC)  Comments:  stable.   Due for eye exam, he will schedule.   Orders:  -     metFORMIN (GLUCOPHAGE) 1,000 mg tablet; TAKE 1 TABLET BY MOUTH TWICE DAILY  -     CBC WITH AUTOMATED DIFF  -     HEMOGLOBIN A1C WITH EAG  -     LIPID PANEL  -     METABOLIC PANEL, COMPREHENSIVE  -  MICROALBUMIN, UR, RAND W/ MICROALB/CREAT RATIO    3. Benign hypertension  Comments:  well controlled.   Orders:  -     metoprolol tartrate (LOPRESSOR) 25 mg tablet; Take 1 Tab  by mouth two (2) times a day.    4. Myocardial infarction, unspecified MI type, unspecified artery (HCC)  Comments:  follows with Dr. Garnette.   Orders:  -     metoprolol tartrate (LOPRESSOR) 25 mg tablet; Take 1 Tab by mouth two (2) times a day.    5. Impingement syndrome of right shoulder  Comments:  steroids to help with inflammation and pain.   Referral to ortho placed.   Orders:  -     REFERRAL TO ORTHOPEDICS  -     predniSONE (DELTASONE) 20 mg tablet; Take 60 mg by mouth daily (with breakfast).    6. COPD with acute exacerbation (HCC)  Comments:  restart on inhalers, daily and rescue.   Steroids to help with acute exacerbation.   Orders:  -     budesonide-formoteroL (Symbicort) 160-4.5 mcg/actuation HFAA; Take 2 Puffs by inhalation two (2) times a day.  -     albuterol (PROVENTIL HFA, VENTOLIN HFA, PROAIR HFA) 90 mcg/actuation inhaler; Take 1 Puff by inhalation every six (6) hours as needed for Wheezing.  -     predniSONE (DELTASONE) 20 mg tablet; Take 60 mg by mouth daily (with breakfast).         Merilee Wible, NP

## 2019-12-13 NOTE — Progress Notes (Signed)
Left vm x1 and faxed

## 2019-12-13 NOTE — Progress Notes (Signed)
1. Have you been to the ER, urgent care clinic since your last visit?  Hospitalized since your last visit? No   2. Have you seen or consulted any other health care providers outside of the [facility] since your last visit?  Include any pap smears or colon screening. No  Visit Vitals  BP 108/74 (BP 1 Location: Right arm, BP Patient Position: At rest, BP Cuff Size: Adult)   Pulse 78   Temp 98.1 ??F (36.7 ??C) (Temporal)   Resp 18   Ht 6' (1.829 m)   Wt 280 lb 3.2 oz (127.1 kg)   SpO2 94%   BMI 38.00 kg/m??     Chief Complaint   Patient presents with   ??? Follow Up Chronic Condition     Pt is asking for refills on metformin, atorvastatin, lopressor

## 2019-12-14 LAB — METABOLIC PANEL, COMPREHENSIVE
A-G Ratio: 1.8 (ref 1.2–2.2)
ALT (SGPT): 21 IU/L (ref 0–44)
AST (SGOT): 19 IU/L (ref 0–40)
Albumin: 4.4 g/dL (ref 3.8–4.9)
Alk. phosphatase: 67 IU/L (ref 39–117)
BUN/Creatinine ratio: 14 (ref 9–20)
BUN: 9 mg/dL (ref 6–24)
Bilirubin, total: 0.6 mg/dL (ref 0.0–1.2)
CO2: 30 mmol/L — ABNORMAL HIGH (ref 20–29)
Calcium: 9.1 mg/dL (ref 8.7–10.2)
Chloride: 100 mmol/L (ref 96–106)
Creatinine: 0.63 mg/dL — ABNORMAL LOW (ref 0.76–1.27)
GFR est AA: 125 mL/min/{1.73_m2} (ref >59)
GFR est non-AA: 108 mL/min/{1.73_m2} (ref >59)
GLOBULIN, TOTAL: 2.4 g/dL (ref 1.5–4.5)
Glucose: 133 mg/dL — ABNORMAL HIGH (ref 65–99)
Potassium: 4.9 mmol/L (ref 3.5–5.2)
Protein, total: 6.8 g/dL (ref 6.0–8.5)
Sodium: 140 mmol/L (ref 134–144)

## 2019-12-14 LAB — CBC WITH AUTOMATED DIFF
ABS. BASOPHILS: 0.1 10*3/uL (ref 0.0–0.2)
ABS. EOSINOPHILS: 0.1 10*3/uL (ref 0.0–0.4)
ABS. IMM. GRANS.: 0 10*3/uL (ref 0.0–0.1)
ABS. MONOCYTES: 0.4 10*3/uL (ref 0.1–0.9)
ABS. NEUTROPHILS: 3.8 10*3/uL (ref 1.4–7.0)
Abs Lymphocytes: 1.4 10*3/uL (ref 0.7–3.1)
BASOPHILS: 1 %
EOSINOPHILS: 2 %
HCT: 44.4 % (ref 37.5–51.0)
HGB: 14.8 g/dL (ref 13.0–17.7)
IMMATURE GRANULOCYTES: 0 %
Lymphocytes: 24 %
MCH: 30.8 pg (ref 26.6–33.0)
MCHC: 33.3 g/dL (ref 31.5–35.7)
MCV: 93 fL (ref 79–97)
MONOCYTES: 7 %
NEUTROPHILS: 66 %
PLATELET: 209 10*3/uL (ref 150–450)
RBC: 4.8 x10E6/uL (ref 4.14–5.80)
RDW: 12.6 % (ref 11.6–15.4)
WBC: 5.8 10*3/uL (ref 3.4–10.8)

## 2019-12-14 LAB — LIPID PANEL
Cholesterol, Total: 128 mg/dL (ref 100–199)
Cholesterol, total: 128 mg/dL (ref 100–199)
HDL Cholesterol: 35 mg/dL — ABNORMAL LOW (ref >39)
HDL: 35 mg/dL — ABNORMAL LOW (ref >39)
LDL Calculated: 76 mg/dL (ref 0–99)
LDL, calculated: 76 mg/dL (ref 0–99)
Triglyceride: 85 mg/dL (ref 0–149)
Triglycerides: 85 mg/dL (ref 0–149)
VLDL, calculated: 17 mg/dL (ref 5–40)
VLDL: 17 mg/dL (ref 5–40)

## 2019-12-14 LAB — MICROALBUMIN, UR, RAND W/ MICROALB/CREAT RATIO
Creatinine, urine random: 118.3 mg/dL
Microalb/Creat ratio (ug/mg creat.): 15 mg/g creat (ref 0–29)
Microalbumin, urine: 17.9 ug/mL

## 2019-12-14 LAB — HEMOGLOBIN A1C WITH EAG
Estimated average glucose: 143 mg/dL
Hemoglobin A1c: 6.6 % — ABNORMAL HIGH (ref 4.8–5.6)

## 2019-12-14 LAB — CBC WITH AUTO DIFFERENTIAL
Basophils %: 1 %
Basophils Absolute: 0.1 10*3/uL (ref 0.0–0.2)
Eosinophils %: 2 %
Eosinophils Absolute: 0.1 10*3/uL (ref 0.0–0.4)
Granulocyte Absolute Count: 0 10*3/uL (ref 0.0–0.1)
Hematocrit: 44.4 % (ref 37.5–51.0)
Hemoglobin: 14.8 g/dL (ref 13.0–17.7)
Immature Granulocytes: 0 %
Lymphocytes %: 24 %
Lymphocytes Absolute: 1.4 10*3/uL (ref 0.7–3.1)
MCH: 30.8 pg (ref 26.6–33.0)
MCHC: 33.3 g/dL (ref 31.5–35.7)
MCV: 93 fL (ref 79–97)
Monocytes %: 7 %
Monocytes Absolute: 0.4 10*3/uL (ref 0.1–0.9)
Neutrophils %: 66 %
Neutrophils Absolute: 3.8 10*3/uL (ref 1.4–7.0)
Platelets: 209 10*3/uL (ref 150–450)
RBC: 4.8 x10E6/uL (ref 4.14–5.80)
RDW: 12.6 % (ref 11.6–15.4)
WBC: 5.8 10*3/uL (ref 3.4–10.8)

## 2019-12-14 LAB — COMPREHENSIVE METABOLIC PANEL
ALT: 21 IU/L (ref 0–44)
AST: 19 IU/L (ref 0–40)
Albumin/Globulin Ratio: 1.8 NA (ref 1.2–2.2)
Albumin: 4.4 g/dL (ref 3.8–4.9)
Alkaline Phosphatase: 67 IU/L (ref 39–117)
BUN: 9 mg/dL (ref 6–24)
Bun/Cre Ratio: 14 NA (ref 9–20)
CO2: 30 mmol/L — ABNORMAL HIGH (ref 20–29)
Calcium: 9.1 mg/dL (ref 8.7–10.2)
Chloride: 100 mmol/L (ref 96–106)
Creatinine: 0.63 mg/dL — ABNORMAL LOW (ref 0.76–1.27)
EGFR IF NonAfrican American: 108 mL/min/{1.73_m2} (ref >59)
GFR African American: 125 mL/min/{1.73_m2} (ref >59)
Globulin, Total: 2.4 g/dL (ref 1.5–4.5)
Glucose: 133 mg/dL — ABNORMAL HIGH (ref 65–99)
Potassium: 4.9 mmol/L (ref 3.5–5.2)
Sodium: 140 mmol/L (ref 134–144)
Total Bilirubin: 0.6 mg/dL (ref 0.0–1.2)
Total Protein: 6.8 g/dL (ref 6.0–8.5)

## 2019-12-14 LAB — MICROALBUMIN / CREATININE URINE RATIO
Creatinine, Ur: 118.3 mg/dL
Microalb, Ur: 17.9 ug/mL
Microalbumin Creatinine Ratio: 15 mg/g creat (ref 0–29)

## 2019-12-14 LAB — HEMOGLOBIN A1C W/EAG
Hemoglobin A1C: 6.6 % — ABNORMAL HIGH (ref 4.8–5.6)
eAG: 143 mg/dL

## 2020-01-10 ENCOUNTER — Telehealth

## 2020-01-10 MED ORDER — DOXYCYCLINE HYCLATE 100 MG CAP
100 mg | ORAL_CAPSULE | Freq: Two times a day (BID) | ORAL | 0 refills | Status: AC
Start: 2020-01-10 — End: 2020-01-20

## 2020-01-10 NOTE — Telephone Encounter (Signed)
Patient having cough, wheezing and SOB.  Denies fevers.   Prednisone helped for a little while when given x1 month ago.

## 2020-01-10 NOTE — Telephone Encounter (Signed)
Patient states that when he uses the inhaler he spits up phlem and blood. Wants to know if he should continue using it.

## 2020-01-10 NOTE — Telephone Encounter (Signed)
Patient having cough, wheezing and SOB.  Denies fevers.   Prednisone helped for a little while when given x1 month ago.

## 2020-01-10 NOTE — Telephone Encounter (Signed)
Patient states that when he uses the inhaler he spits up phlem and blood. Wants to know if he should continue using it.

## 2020-01-20 ENCOUNTER — Encounter

## 2020-01-31 ENCOUNTER — Inpatient Hospital Stay: Admit: 2020-01-31 | Payer: BLUE CROSS/BLUE SHIELD | Primary: Family

## 2020-01-31 DIAGNOSIS — M75121 Complete rotator cuff tear or rupture of right shoulder, not specified as traumatic: Secondary | ICD-10-CM

## 2020-06-14 ENCOUNTER — Ambulatory Visit: Admit: 2020-06-14 | Discharge: 2020-06-14 | Payer: BLUE CROSS/BLUE SHIELD | Attending: Family | Primary: Family

## 2020-06-14 ENCOUNTER — Ambulatory Visit: Attending: Family | Primary: Family

## 2020-06-14 DIAGNOSIS — Z024 Encounter for examination for driving license: Secondary | ICD-10-CM

## 2020-06-14 LAB — AMB POC URINALYSIS DIP STICK AUTO W/O MICRO
Bilirubin (UA POC): NEGATIVE
Bilirubin, Urine, POC: NEGATIVE
Blood (UA POC): NEGATIVE
Blood (UA POC): NEGATIVE
Ketones (UA POC): NEGATIVE
Ketones, Urine, POC: NEGATIVE
Nitrite, Urine, POC: NEGATIVE
Nitrites (UA POC): NEGATIVE
Protein (UA POC): NEGATIVE
Protein, Urine, POC: NEGATIVE
Specific Gravity, Urine, POC: 1.02 NA (ref 1.001–1.035)
Specific gravity (UA POC): 1.02 (ref 1.001–1.035)
Urobilinogen (UA POC): 1 (ref 0.2–1)
Urobilinogen, POC: 1 (ref 0.2–1)
pH (UA POC): 7 (ref 4.6–8.0)
pH, Urine, POC: 7 NA (ref 4.6–8.0)

## 2020-06-14 LAB — AMB POC WELCH ALLYN SPOT VISION SCREENER

## 2020-06-14 NOTE — Progress Notes (Signed)
 Chief Complaint   Patient presents with   . Employment Physical     pt is here for a dot physical      1. Have you been to the ER, urgent care clinic since your last visit?  Hospitalized since your last visit?no    2. Have you seen or consulted any other health care providers outside of the [facility] since your last visit?  Include any pap smears or colon screening. No  Visit Vitals  BP 138/76 (BP 1 Location: Right arm, BP Patient Position: At rest, BP Cuff Size: Adult)   Pulse 93   Temp 97.7 F (36.5 C) (Temporal)   Resp 18   Ht 6' (1.829 m)   Wt 276 lb (125.2 kg)   SpO2 95%   BMI 37.43 kg/m

## 2020-06-14 NOTE — Progress Notes (Signed)
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     Jaime Nash has medical history which includes COPD, hyperlipidemia, hypertension, previous MI (2019), BPH  And type 2 diabetes.   Patient is compliant with his medication regimen and denies any recent changes to his medications.     Did have stress test and appointment with cardiology this month.  Provides letter today from cardiologist that clears him for DOT exam.     Vitals:    06/14/20 1305   BP: 138/76   Pulse: 93   Resp: 18   Temp: 97.7 ??F (36.5 ??C)   TempSrc: Temporal   SpO2: 95%   Weight: 276 lb (125.2 kg)   Height: 6' (1.829 m)     Patient Active Problem List   Diagnosis Code   ??? Benign hypertension I10   ??? Benign prostatic hyperplasia N40.0   ??? Type II diabetes mellitus (HCC) E11.9   ??? Dyslipidemia E78.5   ??? Myocardial infarction (HCC) I21.9   ??? Impingement syndrome of right shoulder M75.41   ??? COPD with acute exacerbation (HCC) J44.1     Patient Active Problem List    Diagnosis Date Noted   ??? Impingement syndrome of right shoulder 12/13/2019   ??? COPD with acute exacerbation (HCC) 12/13/2019   ??? Benign hypertension 04/28/2019   ??? Benign prostatic hyperplasia 04/28/2019   ??? Type II diabetes mellitus (HCC) 04/28/2019   ??? Dyslipidemia 04/28/2019   ??? Myocardial infarction (HCC) 04/28/2019     Current Outpatient Medications   Medication Sig Dispense Refill   ??? Brilinta 90 mg tablet Take 1 Tablet by mouth two (2) times a day.     ??? aspirin delayed-release 81 mg tablet Take 81 mg by mouth daily.     ??? atorvastatin (LIPITOR) 80 mg tablet TAKE 1 TABLET BY MOUTH AT BEDTIME 90 Tab 1   ??? metFORMIN (GLUCOPHAGE) 1,000 mg tablet TAKE 1 TABLET BY MOUTH TWICE DAILY 180 Tab 1   ??? metoprolol tartrate (LOPRESSOR) 25 mg tablet Take 1 Tab by mouth two (2) times a day. 180 Tab 1   ??? budesonide-formoteroL (Symbicort) 160-4.5 mcg/actuation HFAA Take 2 Puffs by inhalation two (2) times a day. 1 Inhaler 5   ??? albuterol (PROVENTIL HFA,  VENTOLIN HFA, PROAIR HFA) 90 mcg/actuation inhaler Take 1 Puff by inhalation every six (6) hours as needed for Wheezing. 1 Inhaler 3     No Known Allergies  Past Medical History:   Diagnosis Date   ??? Diabetes (HCC)    ??? Heart attack [facility])      Past Surgical History:   Procedure Laterality Date   ??? HX CARPAL TUNNEL RELEASE     ??? HX OTHER SURGICAL      cardiac stent   ??? 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 PR CARDIAC SURG PROCEDURE UNLIST      Open heart surgery      Family History   Problem Relation Age of Onset   ??? Diabetes Mother    ??? Heart Disease Mother    ??? Heart Disease Father      Social History     Tobacco Use   ??? Smoking status: Former Smoker     Types: Cigarettes   ??? Smokeless tobacco: Never Used   Substance Use Topics   ??? Alcohol use: Yes     Comment: socially  Review of Systems   Constitutional: Negative for malaise/fatigue and weight loss.   HENT: Negative.    Eyes: Negative for blurred vision and double vision.   Respiratory: Negative for cough and shortness of breath.    Cardiovascular: Negative for chest pain, palpitations and leg swelling.   Gastrointestinal: Negative for heartburn and nausea.   Genitourinary: Negative.    Musculoskeletal: Negative for joint pain and myalgias.   Skin: Negative for itching and rash.   Neurological: Negative for dizziness, tingling, loss of consciousness, weakness and headaches.   Endo/Heme/Allergies: Does not bruise/bleed easily.   Psychiatric/Behavioral: Negative for depression. The patient is not nervous/anxious.        Physical Exam  Constitutional:       Appearance: Normal appearance.   HENT:      Head: Normocephalic.      Right Ear: Tympanic membrane, ear canal and external ear normal.      Left Ear: Tympanic membrane, ear canal and external ear normal.      Nose: Nose normal.      Mouth/Throat:      Mouth: Mucous membranes are moist.      Pharynx: Oropharynx is clear.   Eyes:      Extraocular Movements: Extraocular movements intact.       Conjunctiva/sclera: Conjunctivae normal.      Pupils: Pupils are equal, round, and reactive to light.   Neck:      Thyroid: No thyromegaly or thyroid tenderness.   Cardiovascular:      Rate and Rhythm: Normal rate and regular rhythm.      Pulses: Normal pulses.      Heart sounds: Normal heart sounds.   Pulmonary:      Effort: Pulmonary effort is normal.      Breath sounds: Normal breath sounds.   Abdominal:      General: Abdomen is flat. Bowel sounds are normal.      Palpations: Abdomen is soft.   Musculoskeletal:         General: Normal range of motion.      Cervical back: Normal range of motion and neck supple.      Right lower leg: No edema.      Left lower leg: No edema.   Skin:     General: Skin is warm and dry.   Neurological:      Mental Status: He is alert and oriented to person, place, and time.   Psychiatric:         Mood and Affect: Mood normal.         Behavior: Behavior normal.         Thought Content: Thought content normal.         Judgment: Judgment normal.           ASSESSMENT and PLAN    ICD-10-CM ICD-9-CM    1. 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   Cardiac clearance scanned into computer.        This patient is cleared for 1 year.    Charlesetta Shanks, NP

## 2020-07-17 ENCOUNTER — Ambulatory Visit: Admit: 2020-07-17 | Discharge: 2020-07-17 | Payer: BLUE CROSS/BLUE SHIELD | Attending: Family | Primary: Family

## 2020-07-17 ENCOUNTER — Ambulatory Visit: Attending: Family | Primary: Family

## 2020-07-17 DIAGNOSIS — E1169 Type 2 diabetes mellitus with other specified complication: Secondary | ICD-10-CM

## 2020-07-17 MED ORDER — DICLOFENAC 1 % TOPICAL GEL
1 % | Freq: Four times a day (QID) | CUTANEOUS | 5 refills | Status: DC
Start: 2020-07-17 — End: 2020-11-14

## 2020-07-17 MED ORDER — ATORVASTATIN 80 MG TAB
80 mg | ORAL_TABLET | ORAL | 1 refills | Status: DC
Start: 2020-07-17 — End: 2021-01-01

## 2020-07-17 MED ORDER — METFORMIN 1,000 MG TAB
1000 mg | ORAL_TABLET | ORAL | 1 refills | Status: DC
Start: 2020-07-17 — End: 2021-01-01

## 2020-07-17 MED ORDER — BUDESONIDE-FORMOTEROL HFA 160 MCG-4.5 MCG/ACTUATION AEROSOL INHALER
Freq: Two times a day (BID) | RESPIRATORY_TRACT | 5 refills | Status: DC
Start: 2020-07-17 — End: 2021-01-01

## 2020-07-17 MED ORDER — METOPROLOL TARTRATE 25 MG TAB
25 mg | ORAL_TABLET | Freq: Two times a day (BID) | ORAL | 1 refills | Status: DC
Start: 2020-07-17 — End: 2021-01-01

## 2020-07-17 MED ORDER — ALBUTEROL SULFATE HFA 90 MCG/ACTUATION AEROSOL INHALER
90 mcg/actuation | Freq: Four times a day (QID) | RESPIRATORY_TRACT | 5 refills | Status: DC | PRN
Start: 2020-07-17 — End: 2021-01-01

## 2020-07-17 NOTE — Progress Notes (Signed)
Pt informed

## 2020-07-17 NOTE — Progress Notes (Signed)
Left vm x1

## 2020-07-17 NOTE — Progress Notes (Signed)
HISTORY OF PRESENT ILLNESS  Jaime Nash is a 59 y.o. male presents for Chronic OV    Hypertension: Patients hypertension is well controlled on regimen of metoprolol. Denies headaches, blurred vision or dizziness.   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   Recently had a cath with another stent placed by cardiology, started on brilinta.   ??  Diabetes: Last A1c was   Lab Results   Component Value Date/Time    Hemoglobin A1c 6.6 (H) 12/13/2019 07:35 AM    . Diabetes well controlled on metformin.  Patient's last eye exam was August 2021. Denies neuropathy.    Hyperlipidemia: Mixed hyperlipidemia well controlled on atorvastain.  Denies any complications from medications.   ??  COPD:  Patient notes that his wheezing has improved on the symbicort but still feels SOB at times.  Patient uses albuterol PRN.   Patient quit smoking in 2015,.      Vitals:    07/17/20 1029   BP: 120/77   Pulse: 66   Resp: 18   Temp: 98.3 ??F (36.8 ??C)   TempSrc: Temporal   SpO2: 93%   Weight: 276 lb (125.2 kg)   Height: 6' (1.829 m)     Patient Active Problem List   Diagnosis Code   ??? Benign hypertension I10   ??? Benign prostatic hyperplasia N40.0   ??? Type II diabetes mellitus (HCC) E11.9   ??? Dyslipidemia E78.5   ??? History of MI (myocardial infarction) I25.2   ??? Impingement syndrome of right shoulder M75.41   ??? COPD with acute exacerbation (HCC) J44.1     Patient Active Problem List    Diagnosis Date Noted   ??? Impingement syndrome of right shoulder 12/13/2019   ??? COPD with acute exacerbation (HCC) 12/13/2019   ??? Benign hypertension 04/28/2019   ??? Benign prostatic hyperplasia 04/28/2019   ??? Type II diabetes mellitus (HCC) 04/28/2019   ??? Dyslipidemia 04/28/2019   ??? History of MI (myocardial infarction) 04/28/2019     Current Outpatient Medications   Medication Sig Dispense Refill   ??? potassium 99 mg tablet Take 99 mg by mouth daily.     ??? albuterol (PROVENTIL HFA, VENTOLIN HFA, PROAIR HFA) 90  mcg/actuation inhaler Take 1 Puff by inhalation every six (6) hours as needed for Wheezing. 6.7 g 5   ??? atorvastatin (LIPITOR) 80 mg tablet TAKE 1 TABLET BY MOUTH AT BEDTIME 90 Tablet 1   ??? metFORMIN (GLUCOPHAGE) 1,000 mg tablet TAKE 1 TABLET BY MOUTH TWICE DAILY 180 Tablet 1   ??? metoprolol tartrate (LOPRESSOR) 25 mg tablet Take 1 Tablet by mouth two (2) times a day. 180 Tablet 1   ??? diclofenac (VOLTAREN) 1 % gel Apply 4 g to affected area four (4) times daily. 100 g 5   ??? budesonide-formoteroL (Symbicort) 160-4.5 mcg/actuation HFAA Take 2 Puffs by inhalation two (2) times a day. 10.2 g 5   ??? Brilinta 90 mg tablet Take 1 Tablet by mouth two (2) times a day.     ??? aspirin delayed-release 81 mg tablet Take 81 mg by mouth daily.       No Known Allergies  Past Medical History:   Diagnosis Date   ??? Diabetes (HCC)    ??? Heart attack [facility])      Past Surgical History:   Procedure Laterality Date   ??? HX CARPAL TUNNEL RELEASE     ??? HX OTHER SURGICAL  cardiac stent   ??? 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 HX OTHER SURGICAL  05/2020    cardiac stent    ??? PR CARDIAC SURG PROCEDURE UNLIST      Open heart surgery      Family History   Problem Relation Age of Onset   ??? Diabetes Mother    ??? Heart Disease Mother    ??? Heart Disease Father      Social History     Tobacco Use   ??? Smoking status: Former Smoker     Types: Cigarettes   ??? Smokeless tobacco: Never Used   Substance Use Topics   ??? Alcohol use: Yes     Comment: socially            Review of Systems   Constitutional: Negative for malaise/fatigue and weight loss.   Eyes: Negative for blurred vision and double vision.   Respiratory: Positive for shortness of breath. Negative for cough.    Cardiovascular: Negative for chest pain, palpitations and leg swelling.   Gastrointestinal: Negative for heartburn and nausea.   Musculoskeletal: Positive for joint pain (shoulder). Negative for myalgias.   Skin: Negative for itching and rash.   Neurological: Negative for  dizziness, tingling, loss of consciousness, weakness and headaches.   Endo/Heme/Allergies: Does not bruise/bleed easily.   Psychiatric/Behavioral: Negative for depression. The patient is not nervous/anxious.        Physical Exam  Constitutional:       Appearance: Normal appearance. He is obese.   HENT:      Head: Normocephalic and atraumatic.   Eyes:      Extraocular Movements: Extraocular movements intact.      Conjunctiva/sclera: Conjunctivae normal.      Pupils: Pupils are equal, round, and reactive to light.   Cardiovascular:      Rate and Rhythm: Normal rate and regular rhythm.      Pulses: Normal pulses.   Pulmonary:      Effort: Pulmonary effort is normal.      Breath sounds: Normal breath sounds.   Musculoskeletal:         General: Normal range of motion.   Skin:     General: Skin is warm and dry.   Neurological:      General: No focal deficit present.      Mental Status: He is alert and oriented to person, place, and time.   Psychiatric:         Mood and Affect: Mood normal.         Behavior: Behavior normal.           ASSESSMENT and PLAN  Diagnoses and all orders for this visit:    1. Type 2 diabetes mellitus with other specified complication, without long-term current use of insulin (HCC)  Comments:  stable.     Orders:  -     metFORMIN (GLUCOPHAGE) 1,000 mg tablet; TAKE 1 TABLET BY MOUTH TWICE DAILY  -     CBC WITH AUTOMATED DIFF  -     METABOLIC PANEL, COMPREHENSIVE  -     LIPID PANEL  -     HEMOGLOBIN A1C WITH EAG    2. COPD with acute exacerbation (HCC)  Comments:  restart on inhalers, daily and rescue.     Orders:  -     albuterol (PROVENTIL HFA, VENTOLIN HFA, PROAIR HFA) 90 mcg/actuation inhaler; Take 1 Puff by inhalation every six (  6) hours as needed for Wheezing.  -     budesonide-formoteroL (Symbicort) 160-4.5 mcg/actuation HFAA; Take 2 Puffs by inhalation two (2) times a day.    3. Dyslipidemia  Comments:  well controlled on statin.   Orders:  -     atorvastatin (LIPITOR) 80 mg tablet; TAKE 1  TABLET BY MOUTH AT BEDTIME    4. Benign hypertension  Comments:  well controlled.   Orders:  -     metoprolol tartrate (LOPRESSOR) 25 mg tablet; Take 1 Tablet by mouth two (2) times a day.    5. Myocardial infarction, unspecified MI type, unspecified artery (HCC)  Comments:  follows with Dr. Roque Lias.   Orders:  -     metoprolol tartrate (LOPRESSOR) 25 mg tablet; Take 1 Tablet by mouth two (2) times a day.    6. Impingement syndrome of right shoulder  Comments:  told he needs surgery however not cleared by cardiology for surgery  Orders:  -     diclofenac (VOLTAREN) 1 % gel; Apply 4 g to affected area four (4) times daily.    7. Screen for colon cancer  -     COLOGUARD TEST (FECAL DNA COLORECTAL CANCER SCREENING); Future    8. COPD with acute exacerbation (HCC)  Comments:  restart on inhalers, daily and rescue.   Steroids to help with acute exacerbation.   Orders:  -     albuterol (PROVENTIL HFA, VENTOLIN HFA, PROAIR HFA) 90 mcg/actuation inhaler; Take 1 Puff by inhalation every six (6) hours as needed for Wheezing.  -     budesonide-formoteroL (Symbicort) 160-4.5 mcg/actuation HFAA; Take 2 Puffs by inhalation two (2) times a day.         Charlesetta Shanks, NP

## 2020-07-17 NOTE — Progress Notes (Signed)
 Chief Complaint   Patient presents with   . Follow Up Chronic Condition     pt is asking for refills on metformin , atorvastatin , metoprolol , and both inhalers. Pt wants to know if he needs to still take aspirin    1. Have you been to the ER, urgent care clinic since your last visit?  Hospitalized since your last visit?only for surgery     2. Have you seen or consulted any other health care providers outside of the [facility] since your last visit?  Include any pap smears or colon screening. no  Visit Vitals  BP 120/77 (BP 1 Location: Right arm, BP Patient Position: At rest, BP Cuff Size: Adult)   Pulse 66   Temp 98.3 F (36.8 C) (Temporal)   Resp 18   Ht 6' (1.829 m)   Wt 276 lb (125.2 kg)   SpO2 93%   BMI 37.43 kg/m

## 2020-07-17 NOTE — Progress Notes (Signed)
Lab work is all normal.  A1c is 6.6%.       I faxed his labs to his cardiologist for him.

## 2020-07-18 LAB — METABOLIC PANEL, COMPREHENSIVE
A-G Ratio: 2.3 — ABNORMAL HIGH (ref 1.2–2.2)
ALT (SGPT): 23 IU/L (ref 0–44)
AST (SGOT): 17 IU/L (ref 0–40)
Albumin: 4.6 g/dL (ref 3.8–4.9)
Alk. phosphatase: 65 IU/L (ref 44–121)
BUN/Creatinine ratio: 11 (ref 9–20)
BUN: 8 mg/dL (ref 6–24)
Bilirubin, total: 0.7 mg/dL (ref 0.0–1.2)
CO2: 25 mmol/L (ref 20–29)
Calcium: 9.1 mg/dL (ref 8.7–10.2)
Chloride: 101 mmol/L (ref 96–106)
Creatinine: 0.73 mg/dL — ABNORMAL LOW (ref 0.76–1.27)
GFR est AA: 117 mL/min/{1.73_m2} (ref >59)
GFR est non-AA: 102 mL/min/{1.73_m2} (ref >59)
GLOBULIN, TOTAL: 2 g/dL (ref 1.5–4.5)
Glucose: 99 mg/dL (ref 65–99)
Potassium: 4.8 mmol/L (ref 3.5–5.2)
Protein, total: 6.6 g/dL (ref 6.0–8.5)
Sodium: 141 mmol/L (ref 134–144)

## 2020-07-18 LAB — LIPID PANEL
Cholesterol, Total: 121 mg/dL (ref 100–199)
Cholesterol, total: 121 mg/dL (ref 100–199)
HDL Cholesterol: 34 mg/dL — ABNORMAL LOW (ref >39)
HDL: 34 mg/dL — ABNORMAL LOW (ref >39)
LDL Calculated: 69 mg/dL (ref 0–99)
LDL, calculated: 69 mg/dL (ref 0–99)
Triglyceride: 93 mg/dL (ref 0–149)
Triglycerides: 93 mg/dL (ref 0–149)
VLDL, calculated: 18 mg/dL (ref 5–40)
VLDL: 18 mg/dL (ref 5–40)

## 2020-07-18 LAB — CBC WITH AUTOMATED DIFF
ABS. BASOPHILS: 0 10*3/uL (ref 0.0–0.2)
ABS. EOSINOPHILS: 0.1 10*3/uL (ref 0.0–0.4)
ABS. IMM. GRANS.: 0 10*3/uL (ref 0.0–0.1)
ABS. MONOCYTES: 0.6 10*3/uL (ref 0.1–0.9)
ABS. NEUTROPHILS: 5.1 10*3/uL (ref 1.4–7.0)
Abs Lymphocytes: 1.7 10*3/uL (ref 0.7–3.1)
BASOPHILS: 1 %
EOSINOPHILS: 2 %
HCT: 42 % (ref 37.5–51.0)
HGB: 14.2 g/dL (ref 13.0–17.7)
IMMATURE GRANULOCYTES: 0 %
Lymphocytes: 22 %
MCH: 30.8 pg (ref 26.6–33.0)
MCHC: 33.8 g/dL (ref 31.5–35.7)
MCV: 91 fL (ref 79–97)
MONOCYTES: 8 %
NEUTROPHILS: 67 %
PLATELET: 225 10*3/uL (ref 150–450)
RBC: 4.61 x10E6/uL (ref 4.14–5.80)
RDW: 12.6 % (ref 11.6–15.4)
WBC: 7.5 10*3/uL (ref 3.4–10.8)

## 2020-07-18 LAB — HEMOGLOBIN A1C WITH EAG
Estimated average glucose: 143 mg/dL
Hemoglobin A1c: 6.6 % — ABNORMAL HIGH (ref 4.8–5.6)

## 2020-07-18 LAB — COMPREHENSIVE METABOLIC PANEL
ALT: 23 IU/L (ref 0–44)
AST: 17 IU/L (ref 0–40)
Albumin/Globulin Ratio: 2.3 NA — ABNORMAL HIGH (ref 1.2–2.2)
Albumin: 4.6 g/dL (ref 3.8–4.9)
Alkaline Phosphatase: 65 IU/L (ref 44–121)
BUN: 8 mg/dL (ref 6–24)
Bun/Cre Ratio: 11 NA (ref 9–20)
CO2: 25 mmol/L (ref 20–29)
Calcium: 9.1 mg/dL (ref 8.7–10.2)
Chloride: 101 mmol/L (ref 96–106)
Creatinine: 0.73 mg/dL — ABNORMAL LOW (ref 0.76–1.27)
EGFR IF NonAfrican American: 102 mL/min/{1.73_m2} (ref >59)
GFR African American: 117 mL/min/{1.73_m2} (ref >59)
Globulin, Total: 2 g/dL (ref 1.5–4.5)
Glucose: 99 mg/dL (ref 65–99)
Potassium: 4.8 mmol/L (ref 3.5–5.2)
Sodium: 141 mmol/L (ref 134–144)
Total Bilirubin: 0.7 mg/dL (ref 0.0–1.2)
Total Protein: 6.6 g/dL (ref 6.0–8.5)

## 2020-07-18 LAB — CBC WITH AUTO DIFFERENTIAL
Basophils %: 1 %
Basophils Absolute: 0 10*3/uL (ref 0.0–0.2)
Eosinophils %: 2 %
Eosinophils Absolute: 0.1 10*3/uL (ref 0.0–0.4)
Granulocyte Absolute Count: 0 10*3/uL (ref 0.0–0.1)
Hematocrit: 42 % (ref 37.5–51.0)
Hemoglobin: 14.2 g/dL (ref 13.0–17.7)
Immature Granulocytes: 0 %
Lymphocytes %: 22 %
Lymphocytes Absolute: 1.7 10*3/uL (ref 0.7–3.1)
MCH: 30.8 pg (ref 26.6–33.0)
MCHC: 33.8 g/dL (ref 31.5–35.7)
MCV: 91 fL (ref 79–97)
Monocytes %: 8 %
Monocytes Absolute: 0.6 10*3/uL (ref 0.1–0.9)
Neutrophils %: 67 %
Neutrophils Absolute: 5.1 10*3/uL (ref 1.4–7.0)
Platelets: 225 10*3/uL (ref 150–450)
RBC: 4.61 x10E6/uL (ref 4.14–5.80)
RDW: 12.6 % (ref 11.6–15.4)
WBC: 7.5 10*3/uL (ref 3.4–10.8)

## 2020-07-18 LAB — HEMOGLOBIN A1C W/EAG
Hemoglobin A1C: 6.6 % — ABNORMAL HIGH (ref 4.8–5.6)
eAG: 143 mg/dL

## 2020-07-21 NOTE — Telephone Encounter (Signed)
Patient has used the Diclofenac medication since Monday and every time he uses it his heart starts racing and get highly agitated.  What do you recommend for the patient to do?

## 2020-07-21 NOTE — Telephone Encounter (Signed)
From the gel?  I would recommend he stop using for now if he is having those side effects.   Discuss options with orthpedic MD (maybe they can do an injection?) until he is cleared by cardiology for surgery.

## 2020-07-24 NOTE — Telephone Encounter (Signed)
Pt informed

## 2020-11-07 NOTE — Telephone Encounter (Signed)
Patient is wanting to know if the paperwork he brought in has been received by you and have you sent it in?

## 2020-11-07 NOTE — Telephone Encounter (Signed)
Left vm informing

## 2020-11-07 NOTE — Telephone Encounter (Signed)
Is he asking about the pre op clearance?    This paperwork will be filled out and completed after his pre op exam next week.

## 2020-11-07 NOTE — Telephone Encounter (Signed)
Do you know what paper work pt is referring too?

## 2020-11-14 ENCOUNTER — Ambulatory Visit: Admit: 2020-11-14 | Discharge: 2020-11-14 | Payer: BLUE CROSS/BLUE SHIELD | Attending: Family | Primary: Family

## 2020-11-14 ENCOUNTER — Ambulatory Visit: Attending: Family | Primary: Family

## 2020-11-14 DIAGNOSIS — B078 Other viral warts: Secondary | ICD-10-CM

## 2020-11-14 NOTE — Progress Notes (Signed)
Progress  Notes by Charlesetta Shanks, NP at 11/14/20 1400                Author: Charlesetta Shanks, NP  Service: --  Author Type: Nurse Practitioner       Filed: 11/15/20 0957  Encounter Date: 11/14/2020  Status: Signed          Editor: Charlesetta Shanks, NP (Nurse Practitioner)               HISTORY OF PRESENT ILLNESS   Jaime Nash is a 60 y.o.  male presents for pre op exam.      he is here today for a preoperative evaluation.  Procedure: Right rotator cuff repair  Surgeon: Dr. Raleigh Callas   Date of procedure: 11/29/20   Type of anesthesia: General   History of complications to anesthesia: No  History of allergy to latex: No    For general medical history patient reports taking all medications daily as directed.      Patient is able to walk up a flight of stairs and around a city block without difficulty or SOB.      Patient had cardiac clearance by Dr. Glendon Axe already for procedure.  To hold plavix for 5 days before surgery.          Wart: Patient has wart to right forearm that he reported he can pick off but then it will grow back.  Is irritating to pain.  No bleeding or changes.            Vitals:          11/14/20 1408        BP:  105/62     Pulse:  84     Resp:  20     Temp:  97.9 ??F (36.6 ??C)     SpO2:  95%     Weight:  278 lb 12.8 oz (126.5 kg)        Height:  6' (1.829 m)          Patient Active Problem List        Diagnosis  Code         ?  Benign hypertension  I10     ?  Benign prostatic hyperplasia  N40.0     ?  Type II diabetes mellitus (HCC)  E11.9     ?  Dyslipidemia  E78.5     ?  History of MI (myocardial infarction)  I25.2     ?  Impingement syndrome of right shoulder  M75.41         ?  COPD with acute exacerbation (HCC)  J44.1          Patient Active Problem List           Diagnosis  Date Noted         ?  Impingement syndrome of right shoulder  12/13/2019     ?  COPD with acute exacerbation (HCC)  12/13/2019     ?  Benign hypertension  04/28/2019     ?  Benign prostatic hyperplasia  04/28/2019     ?   Type II diabetes mellitus (HCC)  04/28/2019     ?  Dyslipidemia  04/28/2019         ?  History of MI (myocardial infarction)  04/28/2019          Current Outpatient Medications          Medication  Sig  Dispense  Refill           ?  clopidogreL (PLAVIX) 75 mg tab           ?  potassium 99 mg tablet  Take 99 mg by mouth daily.         ?  albuterol (PROVENTIL HFA, VENTOLIN HFA, PROAIR HFA) 90 mcg/actuation inhaler  Take 1 Puff by inhalation every six (6) hours as needed for Wheezing.  6.7 g  5     ?  atorvastatin (LIPITOR) 80 mg tablet  TAKE 1 TABLET BY MOUTH AT BEDTIME  90 Tablet  1     ?  metFORMIN (GLUCOPHAGE) 1,000 mg tablet  TAKE 1 TABLET BY MOUTH TWICE DAILY  180 Tablet  1     ?  metoprolol tartrate (LOPRESSOR) 25 mg tablet  Take 1 Tablet by mouth two (2) times a day.  180 Tablet  1     ?  budesonide-formoteroL (Symbicort) 160-4.5 mcg/actuation HFAA  Take 2 Puffs by inhalation two (2) times a day.  10.2 g  5           ?  aspirin delayed-release 81 mg tablet  Take 81 mg by mouth daily.            No Known Allergies     Past Medical History:        Diagnosis  Date         ?  Diabetes (HCC)           ?  Heart attack [facility](HCC)            Past Surgical History:         Procedure  Laterality  Date          ?  HX CARPAL TUNNEL RELEASE         ?  HX OTHER SURGICAL              cardiac stent          ?  HX OTHER SURGICAL              nerve damage in left arm           ?  HX OTHER SURGICAL    05/2020          cardiac stent           ?  PR CARDIAC SURG PROCEDURE UNLIST              Open heart surgery           Family History         Problem  Relation  Age of Onset          ?  Diabetes  Mother       ?  Heart Disease  Mother            ?  Heart Disease  Father            Social History          Tobacco Use         ?  Smoking status:  Former Smoker              Types:  Cigarettes         ?  Smokeless tobacco:  Never Used       Substance Use Topics         ?  Alcohol use:  Yes  Comment: socially                 Review  of Systems    Constitutional: Negative for malaise/fatigue and weight loss.    Eyes: Negative for blurred vision and double vision.    Respiratory: Negative for cough and shortness of breath.     Cardiovascular: Negative for chest pain, palpitations and leg swelling.    Gastrointestinal: Negative for heartburn and nausea.    Musculoskeletal: Negative for joint pain and myalgias.    Skin: Negative for itching and rash.    Neurological: Negative for dizziness, tingling, loss of consciousness, weakness and headaches.    Endo/Heme/Allergies: Does not bruise/bleed easily.    Psychiatric/Behavioral: Negative for depression. The patient is not nervous/anxious.           Physical Exam   Constitutional :        Appearance: Normal appearance.   HENT :       Head: Normocephalic.      Right Ear: Tympanic membrane, ear canal and external ear normal.      Left Ear: Tympanic membrane, ear canal and external ear normal.      Nose: Nose normal.      Mouth/Throat:      Mouth: Mucous membranes  are moist.      Pharynx: Oropharynx is clear.   Eyes :       Extraocular Movements: Extraocular movements intact.      Conjunctiva/sclera: Conjunctivae normal.      Pupils: Pupils are equal, round, and reactive to light.   Neck:       Thyroid: No thyromegaly or thyroid tenderness.   Cardiovascular :       Rate and Rhythm: Normal rate and regular rhythm.      Pulses: Normal pulses.      Heart sounds: Normal heart sounds.    Pulmonary:       Effort: Pulmonary effort is normal.      Breath sounds: Normal breath sounds.   Abdominal :      General: Abdomen is flat. Bowel sounds are normal.      Palpations: Abdomen is soft.     Musculoskeletal:          General: Normal range of motion.      Cervical back: Normal range of motion and neck supple.      Right lower leg: No edema.      Left lower leg: No edema.    Skin:      General: Skin is warm and dry.      Findings: Lesion (wart to right forearm)  present.            Neurological :       Mental  Status: He is alert and oriented to person, place, and time.    Psychiatric:         Mood and Affect: Mood normal.         Behavior: Behavior normal.         Thought Content: Thought content normal.         Judgment: Judgment normal.          After informed consent was obtained, using alcohol for cleansing, with sterile technique, cryotherapy with liquid nitrogen was performed. Dressing is applied, and wound care instructions provided.  Be alert for any signs of cutaneous infection. The procedure  was well tolerated without complications. Follow up: the patient may return prn.  ASSESSMENT and PLAN   Diagnoses and all orders for this visit:      1. Other viral warts   Comments:   discussed wound care and when to return if needed.       2. COPD with acute exacerbation (HCC)   Comments:   well controlled on current regimen.       3. Type 2 diabetes mellitus with other specified complication, without long-term current use of insulin (HCC)   Comments:   well controlled on current regimen      4. Rotator cuff arthropathy, right   Comments:   surgery scheduled for 4/13.       5. Pre-op evaluation   Comments:   cleared for surgery.              Charlesetta Shanks, NP

## 2020-11-14 NOTE — Progress Notes (Signed)
 Room:     Identified pt with two pt identifiers(name and DOB). Reviewed record in preparation for visit and have obtained necessary documentation. All patient medications has been reviewed.    Chief Complaint   Patient presents with   . Pre-op Exam       Health Maintenance Due   Topic   . Pneumococcal 0-64 years (1 of 2 - PPSV23)   . Foot Exam Q1    . Eye Exam Retinal or Dilated    . Colorectal Cancer Screening Combo    . Shingrix Vaccine Age 60> (1 of 2)   . COVID-19 Vaccine (3 - Booster for [employer] series)   . MICROALBUMIN Q1        Vitals:    11/14/20 1408   Weight: 278 lb 12.8 oz (126.5 kg)         Patient is accompanied by self I have received verbal consent from Jaime Nash to discuss any/all medical information while they are present in the room.

## 2020-11-15 NOTE — Telephone Encounter (Signed)
Office notes and clearance letter re-faxed today

## 2020-11-15 NOTE — Telephone Encounter (Signed)
Please fax office note and clearance for surgery to [phone]

## 2021-01-01 ENCOUNTER — Encounter: Admit: 2021-01-01 | Discharge: 2021-01-01 | Payer: BLUE CROSS/BLUE SHIELD | Attending: Family | Primary: Family

## 2021-01-01 ENCOUNTER — Ambulatory Visit: Attending: Family | Primary: Family

## 2021-01-01 DIAGNOSIS — Z1211 Encounter for screening for malignant neoplasm of colon: Secondary | ICD-10-CM

## 2021-01-01 MED ORDER — METFORMIN 1,000 MG TAB
1000 mg | ORAL_TABLET | ORAL | 1 refills | Status: DC
Start: 2021-01-01 — End: 2021-07-09

## 2021-01-01 MED ORDER — BUDESONIDE-FORMOTEROL HFA 160 MCG-4.5 MCG/ACTUATION AEROSOL INHALER
Freq: Two times a day (BID) | RESPIRATORY_TRACT | 5 refills | Status: DC
Start: 2021-01-01 — End: 2021-07-09

## 2021-01-01 MED ORDER — ALBUTEROL SULFATE HFA 90 MCG/ACTUATION AEROSOL INHALER
90 mcg/actuation | Freq: Four times a day (QID) | RESPIRATORY_TRACT | 5 refills | Status: DC | PRN
Start: 2021-01-01 — End: 2021-07-09

## 2021-01-01 MED ORDER — ATORVASTATIN 80 MG TAB
80 mg | ORAL_TABLET | ORAL | 1 refills | Status: DC
Start: 2021-01-01 — End: 2021-07-09

## 2021-01-01 MED ORDER — METOPROLOL TARTRATE 25 MG TAB
25 mg | ORAL_TABLET | Freq: Two times a day (BID) | ORAL | 1 refills | Status: DC
Start: 2021-01-01 — End: 2021-07-09

## 2021-01-01 NOTE — Progress Notes (Signed)
Chief Complaint   Patient presents with   . Follow Up Chronic Condition     pt is asking for refills on metformin, atorvastatin, metoprolol       1. Have you been to the ER, urgent care clinic since your last visit?  Hospitalized since your last visit? Yes surgery    2. Have you seen or consulted any other health care providers outside of the [facility] since your last visit?  Include any pap smears or colon screening. no    Visit Vitals  BP (!) 145/76 (BP 1 Location: Right arm, BP Patient Position: At rest, BP Cuff Size: Adult)   Pulse 84   Temp 97.1 F (36.2 C) (Temporal)   Resp 18   Ht 6' (1.829 m)   Wt 270 lb (122.5 kg)   SpO2 94%   BMI 36.62 kg/m

## 2021-01-01 NOTE — Progress Notes (Signed)
Lab work is all normal.  A1c is 6.6%

## 2021-01-01 NOTE — Progress Notes (Signed)
Cologuard test is negative.

## 2021-01-01 NOTE — Progress Notes (Signed)
Pt informed

## 2021-01-01 NOTE — Progress Notes (Signed)
HISTORY OF PRESENT ILLNESS  Jaime Nash is a 60 y.o. male presents for Chronic OV    Hypertension: Patients hypertension is well controlled on regimen of metoprolol. Denies headaches, blurred vision or dizziness.   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  Had stent was placed in 2021, started on plavix.   ??  Diabetes: Last A1c was    Lab Results   Component Value Date/Time    Hemoglobin A1c 6.6 (H) 07/17/2020 11:05 AM    . Diabetes well controlled on metformin.  Patient's last eye exam was August 2021. Denies neuropathy.    Hyperlipidemia: Mixed hyperlipidemia well controlled on atorvastain.  Denies any complications from medications.   ??  COPD:  Patient notes that his wheezing has improved on the symbicort. Patient uses albuterol PRN.   Patient quit smoking in 2015,.    Wart: patient has wart to right forearm.  Had cyrotherapy at last visit, believes small wart is returning.     Vitals:    01/01/21 1055 01/01/21 1119   BP: (!) 145/76 116/68   Pulse: 84    Resp: 18    Temp: 97.1 ??F (36.2 ??C)    TempSrc: Temporal    SpO2: 94%    Weight: 270 lb (122.5 kg)    Height: 6' (1.829 m)      Patient Active Problem List   Diagnosis Code   ??? Benign hypertension I10   ??? Benign prostatic hyperplasia N40.0   ??? Type II diabetes mellitus (HCC) E11.9   ??? Dyslipidemia E78.5   ??? History of MI (myocardial infarction) I25.2   ??? Impingement syndrome of right shoulder M75.41   ??? COPD with acute exacerbation (HCC) J44.1     Patient Active Problem List    Diagnosis Date Noted   ??? Impingement syndrome of right shoulder 12/13/2019   ??? COPD with acute exacerbation (HCC) 12/13/2019   ??? Benign hypertension 04/28/2019   ??? Benign prostatic hyperplasia 04/28/2019   ??? Type II diabetes mellitus (HCC) 04/28/2019   ??? Dyslipidemia 04/28/2019   ??? History of MI (myocardial infarction) 04/28/2019     Current Outpatient Medications   Medication Sig Dispense Refill   ??? metoprolol tartrate (LOPRESSOR) 25 mg tablet  Take 1 Tablet by mouth two (2) times a day. 180 Tablet 1   ??? metFORMIN (GLUCOPHAGE) 1,000 mg tablet TAKE 1 TABLET BY MOUTH TWICE DAILY 180 Tablet 1   ??? budesonide-formoteroL (Symbicort) 160-4.5 mcg/actuation HFAA Take 2 Puffs by inhalation two (2) times a day. 10.2 g 5   ??? atorvastatin (LIPITOR) 80 mg tablet TAKE 1 TABLET BY MOUTH AT BEDTIME 90 Tablet 1   ??? albuterol (PROVENTIL HFA, VENTOLIN HFA, PROAIR HFA) 90 mcg/actuation inhaler Take 1 Puff by inhalation every six (6) hours as needed for Wheezing. 6.7 g 5   ??? clopidogreL (PLAVIX) 75 mg tab      ??? potassium 99 mg tablet Take 99 mg by mouth daily.     ??? aspirin delayed-release 81 mg tablet Take 81 mg by mouth daily.       No Known Allergies  Past Medical History:   Diagnosis Date   ??? Diabetes (HCC)    ??? Heart attack [facility])      Past Surgical History:   Procedure Laterality Date   ??? HX CARPAL TUNNEL RELEASE     ??? HX OTHER SURGICAL      cardiac stent   ??? HX OTHER  SURGICAL      nerve damage in left arm    ??? HX OTHER SURGICAL  05/2020    cardiac stent    ??? HX ROTATOR CUFF REPAIR      r arm   ??? PR CARDIAC SURG PROCEDURE UNLIST      Open heart surgery      Family History   Problem Relation Age of Onset   ??? Diabetes Mother    ??? Heart Disease Mother    ??? Heart Disease Father      Social History     Tobacco Use   ??? Smoking status: Former Smoker     Types: Cigarettes   ??? Smokeless tobacco: Never Used   Substance Use Topics   ??? Alcohol use: Yes     Comment: socially            Review of Systems   Constitutional: Negative for malaise/fatigue and weight loss.   Eyes: Negative for blurred vision and double vision.   Respiratory: Negative for cough and shortness of breath.    Cardiovascular: Negative for chest pain, palpitations and leg swelling.   Gastrointestinal: Negative for heartburn and nausea.   Musculoskeletal: Positive for joint pain (shoulder). Negative for myalgias.   Skin: Negative for itching and rash.   Neurological: Negative for dizziness, tingling, loss of  consciousness, weakness and headaches.   Endo/Heme/Allergies: Does not bruise/bleed easily.   Psychiatric/Behavioral: Negative for depression. The patient is not nervous/anxious.        Physical Exam  Constitutional:       Appearance: Normal appearance. He is obese.   HENT:      Head: Normocephalic and atraumatic.   Eyes:      Extraocular Movements: Extraocular movements intact.      Conjunctiva/sclera: Conjunctivae normal.      Pupils: Pupils are equal, round, and reactive to light.   Cardiovascular:      Rate and Rhythm: Normal rate and regular rhythm.      Pulses: Normal pulses.   Pulmonary:      Effort: Pulmonary effort is normal.      Breath sounds: Normal breath sounds.   Musculoskeletal:         General: Normal range of motion.      Comments: Right shoulder immobilizer    Skin:     General: Skin is warm and dry.   Neurological:      General: No focal deficit present.      Mental Status: He is alert and oriented to person, place, and time.   Psychiatric:         Mood and Affect: Mood normal.         Behavior: Behavior normal.       After informed consent was obtained, using alcohol for cleansing, with sterile technique, cryotherapy with liquid nitrogen was performed. Dressing is applied, and wound care instructions provided.  Be alert for any signs of cutaneous infection. The procedure was well tolerated without complications. Follow up: the patient may return prn.     ASSESSMENT and PLAN  Diagnoses and all orders for this visit:    1. Screen for colon cancer  -     COLOGUARD TEST (FECAL DNA COLORECTAL CANCER SCREENING)    2. Benign hypertension  Comments:  well controlled.   Orders:  -     metoprolol tartrate (LOPRESSOR) 25 mg tablet; Take 1 Tablet by mouth two (2) times a day.    3. Myocardial infarction, unspecified  MI type, unspecified artery (HCC)  Comments:  follows with Dr. Roque Lias.   Orders:  -     metoprolol tartrate (LOPRESSOR) 25 mg tablet; Take 1 Tablet by mouth two (2) times a day.    4. Type 2  diabetes mellitus with other specified complication, without long-term current use of insulin (HCC)  Comments:  stable.     Orders:  -     metFORMIN (GLUCOPHAGE) 1,000 mg tablet; TAKE 1 TABLET BY MOUTH TWICE DAILY  -     CBC WITH AUTOMATED DIFF  -     HEMOGLOBIN A1C WITH EAG  -     LIPID PANEL  -     METABOLIC PANEL, COMPREHENSIVE  -     MICROALBUMIN, UR, RAND W/ MICROALB/CREAT RATIO    5. Mixed simple and mucopurulent chronic bronchitis (HCC)  Comments:  well controlled on inhalers   Orders:  -     budesonide-formoteroL (Symbicort) 160-4.5 mcg/actuation HFAA; Take 2 Puffs by inhalation two (2) times a day.    6. Dyslipidemia  Comments:  well controlled on statin.   Orders:  -     atorvastatin (LIPITOR) 80 mg tablet; TAKE 1 TABLET BY MOUTH AT BEDTIME    7. COPD with acute exacerbation (HCC)  Comments:  restart on inhalers, daily and rescue.     Orders:  -     albuterol (PROVENTIL HFA, VENTOLIN HFA, PROAIR HFA) 90 mcg/actuation inhaler; Take 1 Puff by inhalation every six (6) hours as needed for Wheezing.    8. Other viral warts         Charlesetta Shanks, NP

## 2021-01-02 LAB — CBC WITH AUTOMATED DIFF
ABS. BASOPHILS: 0 10*3/uL (ref 0.0–0.2)
ABS. EOSINOPHILS: 0.1 10*3/uL (ref 0.0–0.4)
ABS. IMM. GRANS.: 0 10*3/uL (ref 0.0–0.1)
ABS. MONOCYTES: 0.4 10*3/uL (ref 0.1–0.9)
ABS. NEUTROPHILS: 3.5 10*3/uL (ref 1.4–7.0)
Abs Lymphocytes: 1.3 10*3/uL (ref 0.7–3.1)
BASOPHILS: 1 %
EOSINOPHILS: 2 %
HCT: 40.6 % (ref 37.5–51.0)
HGB: 13.5 g/dL (ref 13.0–17.7)
IMMATURE GRANULOCYTES: 0 %
Lymphocytes: 25 %
MCH: 30.1 pg (ref 26.6–33.0)
MCHC: 33.3 g/dL (ref 31.5–35.7)
MCV: 90 fL (ref 79–97)
MONOCYTES: 7 %
NEUTROPHILS: 65 %
PLATELET: 211 10*3/uL (ref 150–450)
RBC: 4.49 x10E6/uL (ref 4.14–5.80)
RDW: 13 % (ref 11.6–15.4)
WBC: 5.3 10*3/uL (ref 3.4–10.8)

## 2021-01-02 LAB — METABOLIC PANEL, COMPREHENSIVE
A-G Ratio: 2.1 (ref 1.2–2.2)
ALT (SGPT): 30 IU/L (ref 0–44)
AST (SGOT): 22 IU/L (ref 0–40)
Albumin: 4.5 g/dL (ref 3.8–4.9)
Alk. phosphatase: 70 IU/L (ref 44–121)
BUN/Creatinine ratio: 14 (ref 10–24)
BUN: 10 mg/dL (ref 8–27)
Bilirubin, total: 0.6 mg/dL (ref 0.0–1.2)
CO2: 25 mmol/L (ref 20–29)
Calcium: 9.3 mg/dL (ref 8.6–10.2)
Chloride: 96 mmol/L (ref 96–106)
Creatinine: 0.69 mg/dL — ABNORMAL LOW (ref 0.76–1.27)
GLOBULIN, TOTAL: 2.1 g/dL (ref 1.5–4.5)
Glucose: 159 mg/dL — ABNORMAL HIGH (ref 65–99)
Potassium: 3.8 mmol/L (ref 3.5–5.2)
Protein, total: 6.6 g/dL (ref 6.0–8.5)
Sodium: 137 mmol/L (ref 134–144)
eGFR: 106 mL/min/{1.73_m2} (ref >59)

## 2021-01-02 LAB — LIPID PANEL
Cholesterol, Total: 123 mg/dL (ref 100–199)
Cholesterol, total: 123 mg/dL (ref 100–199)
HDL Cholesterol: 36 mg/dL — ABNORMAL LOW (ref >39)
HDL: 36 mg/dL — ABNORMAL LOW (ref >39)
LDL Calculated: 65 mg/dL (ref 0–99)
LDL, calculated: 65 mg/dL (ref 0–99)
Triglyceride: 120 mg/dL (ref 0–149)
Triglycerides: 120 mg/dL (ref 0–149)
VLDL, calculated: 22 mg/dL (ref 5–40)
VLDL: 22 mg/dL (ref 5–40)

## 2021-01-02 LAB — HEMOGLOBIN A1C WITH EAG
Estimated average glucose: 143 mg/dL
Hemoglobin A1c: 6.6 % — ABNORMAL HIGH (ref 4.8–5.6)

## 2021-01-02 LAB — MICROALBUMIN, UR, RAND W/ MICROALB/CREAT RATIO
Creatinine, urine random: 219.2 mg/dL
Microalb/Creat ratio (ug/mg creat.): 11 mg/g creat (ref 0–29)
Microalbumin, urine: 25 ug/mL

## 2021-01-02 LAB — CBC WITH AUTO DIFFERENTIAL
Basophils %: 1 %
Basophils Absolute: 0 10*3/uL (ref 0.0–0.2)
Eosinophils %: 2 %
Eosinophils Absolute: 0.1 10*3/uL (ref 0.0–0.4)
Granulocyte Absolute Count: 0 10*3/uL (ref 0.0–0.1)
Hematocrit: 40.6 % (ref 37.5–51.0)
Hemoglobin: 13.5 g/dL (ref 13.0–17.7)
Immature Granulocytes: 0 %
Lymphocytes %: 25 %
Lymphocytes Absolute: 1.3 10*3/uL (ref 0.7–3.1)
MCH: 30.1 pg (ref 26.6–33.0)
MCHC: 33.3 g/dL (ref 31.5–35.7)
MCV: 90 fL (ref 79–97)
Monocytes %: 7 %
Monocytes Absolute: 0.4 10*3/uL (ref 0.1–0.9)
Neutrophils %: 65 %
Neutrophils Absolute: 3.5 10*3/uL (ref 1.4–7.0)
Platelets: 211 10*3/uL (ref 150–450)
RBC: 4.49 x10E6/uL (ref 4.14–5.80)
RDW: 13 % (ref 11.6–15.4)
WBC: 5.3 10*3/uL (ref 3.4–10.8)

## 2021-01-02 LAB — COMPREHENSIVE METABOLIC PANEL
ALT: 30 IU/L (ref 0–44)
AST: 22 IU/L (ref 0–40)
Albumin/Globulin Ratio: 2.1 NA (ref 1.2–2.2)
Albumin: 4.5 g/dL (ref 3.8–4.9)
Alkaline Phosphatase: 70 IU/L (ref 44–121)
BUN: 10 mg/dL (ref 8–27)
Bun/Cre Ratio: 14 NA (ref 10–24)
CO2: 25 mmol/L (ref 20–29)
Calcium: 9.3 mg/dL (ref 8.6–10.2)
Chloride: 96 mmol/L (ref 96–106)
Creatinine: 0.69 mg/dL — ABNORMAL LOW (ref 0.76–1.27)
Est, Glomerular Filtration Rate: 106 mL/min/{1.73_m2} (ref >59)
Globulin, Total: 2.1 g/dL (ref 1.5–4.5)
Glucose: 159 mg/dL — ABNORMAL HIGH (ref 65–99)
Potassium: 3.8 mmol/L (ref 3.5–5.2)
Sodium: 137 mmol/L (ref 134–144)
Total Bilirubin: 0.6 mg/dL (ref 0.0–1.2)
Total Protein: 6.6 g/dL (ref 6.0–8.5)

## 2021-01-02 LAB — MICROALBUMIN / CREATININE URINE RATIO
Creatinine, Ur: 219.2 mg/dL
Microalb, Ur: 25 ug/mL
Microalbumin Creatinine Ratio: 11 mg/g creat (ref 0–29)

## 2021-01-02 LAB — HEMOGLOBIN A1C W/EAG
Hemoglobin A1C: 6.6 % — ABNORMAL HIGH (ref 4.8–5.6)
eAG: 143 mg/dL

## 2021-01-18 LAB — FECAL DNA COLORECTAL CANCER SCREENING (COLOGUARD): FIT-DNA (Cologuard): NEGATIVE

## 2021-06-04 NOTE — Telephone Encounter (Signed)
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 The wife then became upset and stated that her husband needs documentation from [facility] regarding his condition. Please let upfront know what we can do regarding this matter.

## 2021-06-05 NOTE — Telephone Encounter (Signed)
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     He will need to get a clearance letter again from him cardiologist for his heart conditions.

## 2021-07-09 ENCOUNTER — Encounter: Admit: 2021-07-09 | Discharge: 2021-07-09 | Payer: BLUE CROSS/BLUE SHIELD | Attending: Family | Primary: Family

## 2021-07-09 DIAGNOSIS — B078 Other viral warts: Secondary | ICD-10-CM

## 2021-07-09 MED ORDER — METFORMIN 1,000 MG TAB
1000 mg | ORAL_TABLET | ORAL | 1 refills | Status: AC
Start: 2021-07-09 — End: ?

## 2021-07-09 MED ORDER — BUDESONIDE-FORMOTEROL HFA 160 MCG-4.5 MCG/ACTUATION AEROSOL INHALER
Freq: Two times a day (BID) | RESPIRATORY_TRACT | 5 refills | Status: AC
Start: 2021-07-09 — End: ?

## 2021-07-09 MED ORDER — ATORVASTATIN 80 MG TAB
80 mg | ORAL_TABLET | ORAL | 1 refills | Status: AC
Start: 2021-07-09 — End: ?

## 2021-07-09 MED ORDER — METOPROLOL TARTRATE 25 MG TAB
25 mg | ORAL_TABLET | Freq: Two times a day (BID) | ORAL | 1 refills | Status: AC
Start: 2021-07-09 — End: ?

## 2021-07-09 MED ORDER — ALBUTEROL SULFATE HFA 90 MCG/ACTUATION AEROSOL INHALER
90 mcg/actuation | Freq: Four times a day (QID) | RESPIRATORY_TRACT | 5 refills | Status: AC | PRN
Start: 2021-07-09 — End: ?

## 2021-07-09 MED ORDER — SILDENAFIL 25 MG TAB
25 mg | ORAL_TABLET | Freq: Every day | ORAL | 2 refills | Status: AC | PRN
Start: 2021-07-09 — End: ?

## 2021-07-09 NOTE — Progress Notes (Signed)
Lab work is all normal.  A1c is 6.8% which is well controlled.

## 2021-07-09 NOTE — Progress Notes (Signed)
HISTORY OF PRESENT ILLNESS  Jaime Nash is a 60 y.o. male presents for Chronic OV    Hypertension: Patients hypertension is well controlled on regimen of metoprolol. Denies headaches, blurred vision or dizziness.   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  Had stent was placed in 2021, started on plavix.      Diabetes: Last A1c was    Lab Results   Component Value Date/Time    Hemoglobin A1c 6.6 (H) 01/01/2021 11:29 AM    . Diabetes well controlled on metformin.  Patient's last eye exam was August 2021. Denies neuropathy.    Hyperlipidemia: Mixed hyperlipidemia well controlled on atorvastain.  Denies any complications from medications.      COPD:  Patient notes that his wheezing has improved on the symbicort. Patient uses albuterol PRN.   Patient quit smoking in 2015,.    Wart: patient has wart to right forearm.  Had cyrotherapy at last visit, believes small wart is returning and would like to have it frozen again.     Vitals:    07/09/21 1041   BP: 118/73   Pulse: 77   Resp: 18   Temp: 98 ??F (36.7 ??C)   TempSrc: Oral   SpO2: 96%   Weight: 275 lb (124.7 kg)   Height: 6' (1.829 m)     Patient Active Problem List   Diagnosis Code    Benign hypertension I10    Benign prostatic hyperplasia N40.0    Type II diabetes mellitus (HCC) E11.9    Dyslipidemia E78.5    History of MI (myocardial infarction) I25.2    Impingement syndrome of right shoulder M75.41    COPD with acute exacerbation (HCC) J44.1     Patient Active Problem List    Diagnosis Date Noted    Impingement syndrome of right shoulder 12/13/2019    COPD with acute exacerbation (HCC) 12/13/2019    Benign hypertension 04/28/2019    Benign prostatic hyperplasia 04/28/2019    Type II diabetes mellitus (HCC) 04/28/2019    Dyslipidemia 04/28/2019    History of MI (myocardial infarction) 04/28/2019     Current Outpatient Medications   Medication Sig Dispense Refill    ranolazine ER (RANEXA) 500 mg SR tablet Take 500 mg by  mouth two (2) times a day.      metoprolol tartrate (LOPRESSOR) 25 mg tablet Take 1 Tablet by mouth two (2) times a day. 180 Tablet 1    metFORMIN (GLUCOPHAGE) 1,000 mg tablet TAKE 1 TABLET BY MOUTH TWICE DAILY 180 Tablet 1    budesonide-formoteroL (Symbicort) 160-4.5 mcg/actuation HFAA Take 2 Puffs by inhalation two (2) times a day. 10.2 g 5    atorvastatin (LIPITOR) 80 mg tablet TAKE 1 TABLET BY MOUTH AT BEDTIME 90 Tablet 1    albuterol (PROVENTIL HFA, VENTOLIN HFA, PROAIR HFA) 90 mcg/actuation inhaler Take 1 Puff by inhalation every six (6) hours as needed for Wheezing. 6.7 g 5    clopidogreL (PLAVIX) 75 mg tab       potassium 99 mg tablet Take 99 mg by mouth daily.      aspirin delayed-release 81 mg tablet Take 81 mg by mouth daily.       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   Eyes:  Negative for blurred vision and double vision.   Respiratory:  Negative for cough and shortness of breath.    Cardiovascular:  Negative for chest pain, palpitations and leg swelling.   Gastrointestinal:  Negative for heartburn and nausea.   Musculoskeletal:  Negative for joint pain and myalgias.   Skin:  Negative for itching and rash.   Neurological:  Negative for  dizziness, tingling, loss of consciousness, weakness and headaches.   Endo/Heme/Allergies:  Does not bruise/bleed easily.   Psychiatric/Behavioral:  Negative for depression. The patient is not nervous/anxious.      Physical Exam  Constitutional:       Appearance: Normal appearance. He is obese.   HENT:      Head: Normocephalic and atraumatic.   Eyes:      Extraocular Movements: Extraocular movements intact.      Conjunctiva/sclera: Conjunctivae normal.      Pupils: Pupils are equal, round, and reactive to light.   Cardiovascular:      Rate and Rhythm: Normal rate and regular rhythm.      Pulses: Normal pulses.   Pulmonary:      Effort: Pulmonary effort is normal.      Breath sounds: Normal breath sounds.   Musculoskeletal:         General: Normal range of motion.   Skin:     General: Skin is warm and dry.      Findings: Lesion (right forearm) present.   Neurological:      General: No focal deficit present.      Mental Status: He is alert and oriented to person, place, and time.   Psychiatric:         Mood and Affect: Mood normal.         Behavior: Behavior normal.     After informed consent was obtained, using alcohol for cleansing, with sterile technique, cryotherapy with liquid nitrogen was performed. Dressing is applied, and wound care instructions provided.  Be alert for any signs of cutaneous infection. The procedure was well tolerated without complications. Follow up: the patient may return prn.     ASSESSMENT and PLAN  Diagnoses and all orders for this visit:    1. Other viral warts    2. Benign hypertension  Comments:  well controlled.   Orders:  -     metoprolol tartrate (LOPRESSOR) 25 mg tablet; Take 1 Tablet by mouth two (2) times  a day.    3. Myocardial infarction, unspecified MI type, unspecified artery (HCC)  Comments:  follows with Dr. Roque Lias.   Orders:  -     metoprolol tartrate (LOPRESSOR) 25 mg tablet; Take 1 Tablet by mouth two (2) times a day.    4. Type 2 diabetes mellitus with other specified  complication, without long-term current use of insulin (HCC)  Comments:  stable.     Orders:  -     metFORMIN (GLUCOPHAGE) 1,000 mg tablet; TAKE 1 TABLET BY MOUTH TWICE DAILY  -     METABOLIC PANEL, COMPREHENSIVE  -     CBC WITH AUTOMATED DIFF  -     HEMOGLOBIN A1C WITH EAG  -     LIPID PANEL    5. Mixed simple and mucopurulent chronic bronchitis (HCC)  Comments:  well controlled on inhalers   Orders:  -     budesonide-formoteroL (Symbicort) 160-4.5 mcg/actuation HFAA; Take 2 Puffs by inhalation two (2) times a day.    6. Dyslipidemia  Comments:  well controlled on statin.   Orders:  -     atorvastatin (LIPITOR) 80 mg tablet; TAKE 1 TABLET BY MOUTH AT BEDTIME    7. COPD with acute exacerbation (HCC)  Comments:  well controlled  Orders:  -     albuterol (PROVENTIL HFA, VENTOLIN HFA, PROAIR HFA) 90 mcg/actuation inhaler; Take 1 Puff by inhalation every six (6) hours as needed for Wheezing.    8. Severe obesity (BMI 35.0-39.9) with comorbidity (HCC)  Comments:  encouarged to work on diet and exercise to help.           Charlesetta Shanks, NP

## 2021-07-09 NOTE — Progress Notes (Signed)
 Chief Complaint   Patient presents with    Follow Up Chronic Condition    Medication Refill    Diabetes       Visit Vitals  BP 118/73 (BP 1 Location: Right arm, BP Patient Position: Sitting, BP Cuff Size: Adult)   Pulse 77   Temp 98 F (36.7 C) (Oral)   Resp 18   Ht 6' (1.829 m)   Wt 275 lb (124.7 kg)   SpO2 96%   BMI 37.30 kg/m       1. Have you been to the ER, urgent care clinic since your last visit?  Hospitalized since your last visit?No    2. Have you seen or consulted any other health care providers outside of the [facility] since your last visit?  Include any pap smears or colon screening. No

## 2021-07-10 LAB — CBC WITH AUTOMATED DIFF
ABS. BASOPHILS: 0 10*3/uL (ref 0.0–0.2)
ABS. EOSINOPHILS: 0.1 10*3/uL (ref 0.0–0.4)
ABS. IMM. GRANS.: 0 10*3/uL (ref 0.0–0.1)
ABS. MONOCYTES: 0.5 10*3/uL (ref 0.1–0.9)
ABS. NEUTROPHILS: 3.7 10*3/uL (ref 1.4–7.0)
Abs Lymphocytes: 1.7 10*3/uL (ref 0.7–3.1)
BASOPHILS: 1 %
EOSINOPHILS: 2 %
HCT: 44.7 % (ref 37.5–51.0)
HGB: 15.2 g/dL (ref 13.0–17.7)
IMMATURE GRANULOCYTES: 0 %
Lymphocytes: 28 %
MCH: 31.7 pg (ref 26.6–33.0)
MCHC: 34 g/dL (ref 31.5–35.7)
MCV: 93 fL (ref 79–97)
MONOCYTES: 8 %
NEUTROPHILS: 61 %
PLATELET: 198 10*3/uL (ref 150–450)
RBC: 4.8 x10E6/uL (ref 4.14–5.80)
RDW: 12.5 % (ref 11.6–15.4)
WBC: 6 10*3/uL (ref 3.4–10.8)

## 2021-07-10 LAB — LIPID PANEL
Cholesterol, Total: 124 mg/dL (ref 100–199)
Cholesterol, total: 124 mg/dL (ref 100–199)
HDL Cholesterol: 33 mg/dL — ABNORMAL LOW (ref >39)
HDL: 33 mg/dL — ABNORMAL LOW (ref >39)
LDL Calculated: 68 mg/dL (ref 0–99)
LDL, calculated: 68 mg/dL (ref 0–99)
Triglyceride: 129 mg/dL (ref 0–149)
Triglycerides: 129 mg/dL (ref 0–149)
VLDL, calculated: 23 mg/dL (ref 5–40)
VLDL: 23 mg/dL (ref 5–40)

## 2021-07-10 LAB — METABOLIC PANEL, COMPREHENSIVE
A-G Ratio: 2.4 — ABNORMAL HIGH (ref 1.2–2.2)
ALT (SGPT): 19 IU/L (ref 0–44)
AST (SGOT): 16 IU/L (ref 0–40)
Albumin: 4.6 g/dL (ref 3.8–4.9)
Alk. phosphatase: 75 IU/L (ref 44–121)
BUN/Creatinine ratio: 14 (ref 10–24)
BUN: 10 mg/dL (ref 8–27)
Bilirubin, total: 0.9 mg/dL (ref 0.0–1.2)
CO2: 25 mmol/L (ref 20–29)
Calcium: 9.3 mg/dL (ref 8.6–10.2)
Chloride: 98 mmol/L (ref 96–106)
Creatinine: 0.73 mg/dL — ABNORMAL LOW (ref 0.76–1.27)
GLOBULIN, TOTAL: 1.9 g/dL (ref 1.5–4.5)
Glucose: 93 mg/dL (ref 70–99)
Potassium: 4.8 mmol/L (ref 3.5–5.2)
Protein, total: 6.5 g/dL (ref 6.0–8.5)
Sodium: 139 mmol/L (ref 134–144)
eGFR: 104 mL/min/{1.73_m2} (ref >59)

## 2021-07-10 LAB — HEMOGLOBIN A1C WITH EAG
Estimated average glucose: 148 mg/dL
Hemoglobin A1c: 6.8 % — ABNORMAL HIGH (ref 4.8–5.6)

## 2021-07-10 LAB — COMPREHENSIVE METABOLIC PANEL
ALT: 19 IU/L (ref 0–44)
AST: 16 IU/L (ref 0–40)
Albumin/Globulin Ratio: 2.4 NA — ABNORMAL HIGH (ref 1.2–2.2)
Albumin: 4.6 g/dL (ref 3.8–4.9)
Alkaline Phosphatase: 75 IU/L (ref 44–121)
BUN: 10 mg/dL (ref 8–27)
Bun/Cre Ratio: 14 NA (ref 10–24)
CO2: 25 mmol/L (ref 20–29)
Calcium: 9.3 mg/dL (ref 8.6–10.2)
Chloride: 98 mmol/L (ref 96–106)
Creatinine: 0.73 mg/dL — ABNORMAL LOW (ref 0.76–1.27)
Est, Glomerular Filtration Rate: 104 mL/min/{1.73_m2} (ref >59)
Globulin, Total: 1.9 g/dL (ref 1.5–4.5)
Glucose: 93 mg/dL (ref 70–99)
Potassium: 4.8 mmol/L (ref 3.5–5.2)
Sodium: 139 mmol/L (ref 134–144)
Total Bilirubin: 0.9 mg/dL (ref 0.0–1.2)
Total Protein: 6.5 g/dL (ref 6.0–8.5)

## 2021-07-10 LAB — CBC WITH AUTO DIFFERENTIAL
Basophils %: 1 %
Basophils Absolute: 0 10*3/uL (ref 0.0–0.2)
Eosinophils %: 2 %
Eosinophils Absolute: 0.1 10*3/uL (ref 0.0–0.4)
Granulocyte Absolute Count: 0 10*3/uL (ref 0.0–0.1)
Hematocrit: 44.7 % (ref 37.5–51.0)
Hemoglobin: 15.2 g/dL (ref 13.0–17.7)
Immature Granulocytes: 0 %
Lymphocytes %: 28 %
Lymphocytes Absolute: 1.7 10*3/uL (ref 0.7–3.1)
MCH: 31.7 pg (ref 26.6–33.0)
MCHC: 34 g/dL (ref 31.5–35.7)
MCV: 93 fL (ref 79–97)
Monocytes %: 8 %
Monocytes Absolute: 0.5 10*3/uL (ref 0.1–0.9)
Neutrophils %: 61 %
Neutrophils Absolute: 3.7 10*3/uL (ref 1.4–7.0)
Platelets: 198 10*3/uL (ref 150–450)
RBC: 4.8 x10E6/uL (ref 4.14–5.80)
RDW: 12.5 % (ref 11.6–15.4)
WBC: 6 10*3/uL (ref 3.4–10.8)

## 2021-07-10 LAB — HEMOGLOBIN A1C W/EAG
Hemoglobin A1C: 6.8 % — ABNORMAL HIGH (ref 4.8–5.6)
eAG: 148 mg/dL

## 2022-01-22 MED ORDER — BUDESONIDE-FORMOTEROL FUMARATE 160-4.5 MCG/ACT IN AERO
Freq: Two times a day (BID) | RESPIRATORY_TRACT | 1 refills | Status: DC
Start: 2022-01-22 — End: 2022-03-04

## 2022-01-22 MED ORDER — SILDENAFIL CITRATE 25 MG PO TABS
25 MG | ORAL_TABLET | Freq: Every day | ORAL | 0 refills | Status: AC | PRN
Start: 2022-01-22 — End: 2022-09-05

## 2022-01-22 MED ORDER — METOPROLOL TARTRATE 25 MG PO TABS
25 MG | ORAL_TABLET | Freq: Two times a day (BID) | ORAL | 0 refills | Status: AC
Start: 2022-01-22 — End: 2022-03-04

## 2022-01-22 MED ORDER — METFORMIN HCL 1000 MG PO TABS
1000 MG | ORAL_TABLET | Freq: Two times a day (BID) | ORAL | 0 refills | Status: DC
Start: 2022-01-22 — End: 2022-03-04

## 2022-01-22 MED ORDER — ATORVASTATIN CALCIUM 80 MG PO TABS
80 MG | ORAL_TABLET | Freq: Every evening | ORAL | 0 refills | Status: AC
Start: 2022-01-22 — End: 2022-03-04

## 2022-01-22 NOTE — Telephone Encounter (Signed)
Called and left message.

## 2022-01-22 NOTE — Telephone Encounter (Signed)
-----   Message from Alger Memos sent at 01/22/2022 10:09 AM EDT -----  Subject: Refill Request    QUESTIONS  Name of Medication? albuterol sulfate HFA (PROVENTIL;VENTOLIN;PROAIR) 108   (90 Base) MCG/ACT inhaler  Patient-reported dosage and instructions? 108 mcg   How many days do you have left? 7  Preferred Pharmacy? [facility]  Pharmacy phone number (if available)? [phone]  Additional Information for Provider? Patient needs refills on his   medication his appt is on 7/17  ---------------------------------------------------------------------------  --------------,  Name of Medication? aspirin 81 MG EC tablet  Patient-reported dosage and instructions? 81 mg tablet   How many days do you have left? 7  Preferred Pharmacy? [facility]  Pharmacy phone number (if available)? [phone]  ---------------------------------------------------------------------------  --------------,  Name of Medication? atorvastatin (LIPITOR) 80 MG tablet  Patient-reported dosage and instructions? 80 mg tablet  How many days do you have left? 7  Preferred Pharmacy? [facility]  Pharmacy phone number (if available)? [phone]  ---------------------------------------------------------------------------  --------------,  Name of Medication? budesonide-formoterol (SYMBICORT) 160-4.5 MCG/ACT AERO  Patient-reported dosage and instructions? 160-4.5  How many days do you have left? 7  Preferred Pharmacy? [facility]  Pharmacy phone number (if available)? [phone]  ---------------------------------------------------------------------------  --------------,  Name of Medication? clopidogrel (PLAVIX) 75 MG tablet  Patient-reported dosage and instructions? 75 mg tablet   How many days do you have left? 7  Preferred Pharmacy? [facility]  Pharmacy phone number (if available)? [phone]  ---------------------------------------------------------------------------  --------------,  Name of  Medication? metFORMIN (GLUCOPHAGE) 1000 MG tablet  Patient-reported dosage and instructions? 1000 mg tablet   How many days do you have left? 7  Preferred Pharmacy? [facility]  Pharmacy phone number (if available)? [phone]  ---------------------------------------------------------------------------  --------------,  Name of Medication? metoprolol tartrate (LOPRESSOR) 25 MG tablet  Patient-reported dosage and instructions? 25 mg tablet  How many days do you have left? 7  Preferred Pharmacy? [facility]  Pharmacy phone number (if available)? [phone]  ---------------------------------------------------------------------------  --------------,  Name of Medication? potassium gluconate 550 mg tablet  Patient-reported dosage and instructions? 550 mg tablet  How many days do you have left? 7  Preferred Pharmacy? [facility]  Pharmacy phone number (if available)? [phone]  ---------------------------------------------------------------------------  --------------,  Name of Medication? ranolazine (RANEXA) 500 MG extended release tablet  Patient-reported dosage and instructions? 500 mg tablet  How many days do you have left? 7  Preferred Pharmacy? [facility]  Pharmacy phone number (if available)? [phone]  ---------------------------------------------------------------------------  --------------,  Name of Medication? sildenafil (VIAGRA) 25 MG tablet  Patient-reported dosage and instructions? 25 mg tablet   How many days do you have left? 7  Preferred Pharmacy? [facility]  Pharmacy phone number (if available)? [phone]  ---------------------------------------------------------------------------  --------------  Cleotis Lema INFO  What is the best way for the office to contact you? OK to leave message on   voicemail  Preferred Call Back Phone Number?  [phone]  ---------------------------------------------------------------------------  --------------  SCRIPT ANSWERS  Relationship to Patient? Self

## 2022-01-22 NOTE — Telephone Encounter (Signed)
Please let patient know that I sent in the medications that we manage but he generally gets his heart medications from his cardiologist (plavix, renexa, potassium) so please follow up with them to decide if these are medications they would like him to continue.

## 2022-01-24 NOTE — Telephone Encounter (Signed)
Called and left message.

## 2022-03-04 ENCOUNTER — Ambulatory Visit: Admit: 2022-03-04 | Discharge: 2022-03-04 | Payer: BLUE CROSS/BLUE SHIELD | Attending: Family | Primary: Family

## 2022-03-04 DIAGNOSIS — J418 Mixed simple and mucopurulent chronic bronchitis: Secondary | ICD-10-CM

## 2022-03-04 MED ORDER — METOPROLOL TARTRATE 25 MG PO TABS
25 MG | ORAL_TABLET | Freq: Two times a day (BID) | ORAL | 0 refills | Status: AC
Start: 2022-03-04 — End: 2022-06-18

## 2022-03-04 MED ORDER — MONTELUKAST SODIUM 10 MG PO TABS
10 MG | ORAL_TABLET | Freq: Every day | ORAL | 1 refills | Status: DC
Start: 2022-03-04 — End: 2022-09-05

## 2022-03-04 MED ORDER — ALBUTEROL SULFATE HFA 108 (90 BASE) MCG/ACT IN AERS
108 (90 Base) MCG/ACT | Freq: Four times a day (QID) | RESPIRATORY_TRACT | 2 refills | Status: AC | PRN
Start: 2022-03-04 — End: ?

## 2022-03-04 MED ORDER — ATORVASTATIN CALCIUM 80 MG PO TABS
80 MG | ORAL_TABLET | Freq: Every evening | ORAL | 1 refills | Status: DC
Start: 2022-03-04 — End: 2022-09-05

## 2022-03-04 MED ORDER — BUDESONIDE-FORMOTEROL FUMARATE 160-4.5 MCG/ACT IN AERO
Freq: Two times a day (BID) | RESPIRATORY_TRACT | 5 refills | Status: DC
Start: 2022-03-04 — End: 2022-09-05

## 2022-03-04 MED ORDER — METFORMIN HCL 1000 MG PO TABS
1000 MG | ORAL_TABLET | Freq: Two times a day (BID) | ORAL | 1 refills | Status: DC
Start: 2022-03-04 — End: 2022-09-05

## 2022-03-04 NOTE — Progress Notes (Signed)
Chief Complaint   Patient presents with    Follow-up   BP 122/69   Pulse 55   Temp 97.7 F (36.5 C)   Resp 16   Ht 6' (1.829 m)   Wt 277 lb (125.6 kg)   SpO2 96%   BMI 37.57 kg/m   1. Have you been to the ER, urgent care clinic since your last visit?  Hospitalized since your last visit?No    2. Have you seen or consulted any other health care providers outside of the [facility] since your last visit?  Include any pap smears or colon screening. No

## 2022-03-04 NOTE — Progress Notes (Signed)
HISTORY OF PRESENT ILLNESS  Jaime Nash is a 61 y.o. male presents for follow up on chronic conditions.    Hypertension: Patients hypertension is well controlled on regimen of metoprolol. Denies headaches, blurred vision or dizziness.   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  Had stent was placed in 2021, started on plavix.      Diabetes: Last A1c was          Lab Results   Component Value Date/Time     Hemoglobin A1c 6.6 (H) 01/01/2021 11:29 AM    . Diabetes well controlled on metformin.  Patient's last eye exam was 2023, normal. Denies neuropathy.     Hyperlipidemia: Mixed hyperlipidemia well controlled on atorvastain.  Denies any complications from medications.      COPD:  Patient notes that his wheezing has improved on the symbicort. Patient uses albuterol PRN.   Patient quit smoking in 2015,.    Cardiology: Dr. Roque Lias  Vitals:    03/04/22 1057   BP: 122/69   Pulse: 55   Resp: 16   Temp: 97.7 F (36.5 C)   SpO2: 96%   Weight: 277 lb (125.6 kg)   Height: 6' (1.829 m)     Patient Active Problem List   Diagnosis    Dyslipidemia    Benign hypertension    History of MI (myocardial infarction)    Impingement syndrome of right shoulder    COPD with acute exacerbation (HCC)    Benign prostatic hyperplasia    Type 2 diabetes mellitus with other specified complication, without long-term current use of insulin [facility])     Patient Active Problem List    Diagnosis Date Noted    Impingement syndrome of right shoulder 12/13/2019    COPD with acute exacerbation (HCC) 12/13/2019    Dyslipidemia 04/28/2019    Benign hypertension 04/28/2019    History of MI (myocardial infarction) 04/28/2019    Benign prostatic hyperplasia 04/28/2019    Type 2 diabetes mellitus with other specified complication, without long-term current use of insulin (HCC)      Current Outpatient Medications   Medication Sig Dispense Refill    budesonide-formoterol (SYMBICORT) 160-4.5 MCG/ACT AERO Inhale 2 puffs  into the lungs 2 times daily 10.2 g 5    albuterol sulfate HFA (PROVENTIL;VENTOLIN;PROAIR) 108 (90 Base) MCG/ACT inhaler Inhale 1 puff into the lungs every 6 hours as needed for Shortness of Breath 18 g 2    montelukast (SINGULAIR) 10 MG tablet Take 1 tablet by mouth daily 90 tablet 1    metFORMIN (GLUCOPHAGE) 1000 MG tablet Take 1 tablet by mouth 2 times daily 180 tablet 1    atorvastatin (LIPITOR) 80 MG tablet Take 1 tablet by mouth nightly 90 tablet 1    metoprolol tartrate (LOPRESSOR) 25 MG tablet Take 0.5 tablets by mouth 2 times daily 90 tablet 0    sildenafil (VIAGRA) 25 MG tablet Take 1 tablet by mouth daily as needed for Erectile Dysfunction 30 tablet 0    aspirin 81 MG EC tablet Take 1 tablet by mouth daily      clopidogrel (PLAVIX) 75 MG tablet ceived the following from [facility]: Outside name: clopidogreL (PLAVIX) 75 mg tab      potassium gluconate 550 mg tablet Take 99 mg by mouth daily      ranolazine (RANEXA) 500 MG extended release tablet Take 1 tablet by mouth 2 times daily  No current facility-administered medications for this visit.     No Known Allergies  Past Medical History:   Diagnosis Date    Diabetes (HCC)     Heart attack (HCC)      Past Surgical History:   Procedure Laterality Date    CARPAL TUNNEL RELEASE      OTHER SURGICAL HISTORY  05/2020    cardiac stent     OTHER SURGICAL HISTORY      nerve damage in left arm     OTHER SURGICAL HISTORY      cardiac stent    PR UNLISTED PROCEDURE CARDIAC SURGERY      Open heart surgery     ROTATOR CUFF REPAIR      r arm     Family History   Problem Relation Age of Onset    Heart Disease Mother     Heart Disease Father     Diabetes Mother      Social History     Tobacco Use    Smoking status: Former    Smokeless tobacco: Never   Substance Use Topics    Alcohol use: Yes           Review of Systems   Constitutional:  Negative for fatigue.   Eyes:  Negative for visual disturbance.   Respiratory:  Negative for shortness of breath.     Cardiovascular:  Negative for chest pain, palpitations and leg swelling.   Neurological:  Negative for dizziness, weakness and headaches.   Psychiatric/Behavioral:  Negative for sleep disturbance. The patient is not nervous/anxious.       Physical Exam  Constitutional:       Appearance: Normal appearance. He is obese.   HENT:      Head: Normocephalic.   Eyes:      Conjunctiva/sclera: Conjunctivae normal.   Cardiovascular:      Rate and Rhythm: Normal rate and regular rhythm.   Pulmonary:      Effort: Pulmonary effort is normal.      Breath sounds: Normal breath sounds.   Musculoskeletal:      Cervical back: Normal range of motion.   Skin:     General: Skin is warm and dry.      Findings: Lesion (left upper arm and left upper chest) present.   Neurological:      Mental Status: He is alert and oriented to person, place, and time.   Psychiatric:         Mood and Affect: Mood normal.         Behavior: Behavior normal.         ASSESSMENT and PLAN  [name] was seen today for follow-up.    Diagnoses and all orders for this visit:    Mixed simple and mucopurulent chronic bronchitis (HCC)  Comments:  Stable.  more drainage, not relieved by allergy medication.  start on singular.   Orders:  -     budesonide-formoterol (SYMBICORT) 160-4.5 MCG/ACT AERO; Inhale 2 puffs into the lungs 2 times daily  -     albuterol sulfate HFA (PROVENTIL;VENTOLIN;PROAIR) 108 (90 Base) MCG/ACT inhaler; Inhale 1 puff into the lungs every 6 hours as needed for Shortness of Breath  -     montelukast (SINGULAIR) 10 MG tablet; Take 1 tablet by mouth daily    Type 2 diabetes mellitus with other specified complication, without long-term current use of insulin (HCC)  Comments:  stable.   consider GLP1 or SGLT2 at next visit with new insurance  for diabetes and cardiac coverage  Orders:  -     metFORMIN (GLUCOPHAGE) 1000 MG tablet; Take 1 tablet by mouth 2 times daily  -     Microalbumin / Creatinine Urine Ratio  -     Hemoglobin A1C  -     Lipid Panel  -      CBC with Auto Differential  -     Comprehensive Metabolic Panel    Essential (primary) hypertension  Comments:  well controlled  Orders:  -     metoprolol tartrate (LOPRESSOR) 25 MG tablet; Take 0.5 tablets by mouth 2 times daily    Mixed hyperlipidemia  -     atorvastatin (LIPITOR) 80 MG tablet; Take 1 tablet by mouth nightly    Severe obesity (BMI 35.0-39.9) with comorbidity (HCC)    Abnormal skin growth  Comments:  left arm and chest with rapid changes.  referall to dermatology.   Orders:  -     External Referral To Dermatology         Court Joy, APRN - NP

## 2022-03-05 LAB — LIPID PANEL
Cholesterol: 130 mg/dL (ref 100–199)
HDL: 36 mg/dL — ABNORMAL LOW (ref >39)
LDL Calculated: 72 mg/dL (ref 0–99)
Triglycerides: 124 mg/dL (ref 0–149)
VLDL Cholesterol Calculated: 22 mg/dL (ref 5–40)

## 2022-03-05 LAB — CBC WITH AUTO DIFFERENTIAL
Basophils %: 1 %
Basophils Absolute: 0.1 10*3/uL (ref 0.0–0.2)
Eosinophils %: 1 %
Eosinophils Absolute: 0.1 10*3/uL (ref 0.0–0.4)
Hematocrit: 45.1 % (ref 37.5–51.0)
Hemoglobin: 14.7 g/dL (ref 13.0–17.7)
Immature Grans (Abs): 0 10*3/uL (ref 0.0–0.1)
Immature Granulocytes: 1 %
Lymphocytes %: 22 %
Lymphocytes Absolute: 1.4 10*3/uL (ref 0.7–3.1)
MCH: 30.2 pg (ref 26.6–33.0)
MCHC: 32.6 g/dL (ref 31.5–35.7)
MCV: 93 fL (ref 79–97)
Monocytes %: 7 %
Monocytes Absolute: 0.5 10*3/uL (ref 0.1–0.9)
Neutrophils %: 68 %
Neutrophils Absolute: 4.4 10*3/uL (ref 1.4–7.0)
Platelets: 195 10*3/uL (ref 150–450)
RBC: 4.87 x10E6/uL (ref 4.14–5.80)
RDW: 13 % (ref 11.6–15.4)
WBC: 6.4 10*3/uL (ref 3.4–10.8)

## 2022-03-05 LAB — MICROALBUMIN / CREATININE URINE RATIO
Albumin, U: 18.1 ug/mL
Creatinine, Ur: 118.7 mg/dL
Microalbumin Creatinine Ratio: 15 mg/g creat (ref 0–29)

## 2022-03-05 LAB — COMPREHENSIVE METABOLIC PANEL
ALT: 19 IU/L (ref 0–44)
AST: 19 IU/L (ref 0–40)
Albumin/Globulin Ratio: 1.9 (ref 1.2–2.2)
Albumin: 4.6 g/dL (ref 3.9–4.9)
Alkaline Phosphatase: 76 IU/L (ref 44–121)
BUN/Creatinine Ratio: 10 (ref 10–24)
BUN: 7 mg/dL — ABNORMAL LOW (ref 8–27)
CO2: 27 mmol/L (ref 20–29)
Calcium: 9.3 mg/dL (ref 8.6–10.2)
Chloride: 100 mmol/L (ref 96–106)
Creatinine: 0.73 mg/dL — ABNORMAL LOW (ref 0.76–1.27)
Est, Glomerular Filtration Rate: 104 mL/min/{1.73_m2} (ref >59)
Globulin, Total: 2.4 g/dL (ref 1.5–4.5)
Glucose: 105 mg/dL — ABNORMAL HIGH (ref 70–99)
Potassium: 4.9 mmol/L (ref 3.5–5.2)
Sodium: 143 mmol/L (ref 134–144)
Total Bilirubin: 0.9 mg/dL (ref 0.0–1.2)
Total Protein: 7 g/dL (ref 6.0–8.5)

## 2022-03-05 LAB — HEMOGLOBIN A1C: Hemoglobin A1C: 7 % — ABNORMAL HIGH (ref 4.8–5.6)

## 2022-06-18 ENCOUNTER — Encounter

## 2022-06-18 MED ORDER — METOPROLOL TARTRATE 25 MG PO TABS
25 MG | ORAL_TABLET | Freq: Two times a day (BID) | ORAL | 0 refills | Status: DC
Start: 2022-06-18 — End: 2022-09-05

## 2022-09-05 ENCOUNTER — Encounter: Admit: 2022-09-05 | Discharge: 2022-09-05 | Payer: BLUE CROSS/BLUE SHIELD | Attending: Family | Primary: Family

## 2022-09-05 DIAGNOSIS — J418 Mixed simple and mucopurulent chronic bronchitis: Secondary | ICD-10-CM

## 2022-09-05 MED ORDER — MONTELUKAST SODIUM 10 MG PO TABS
10 MG | ORAL_TABLET | Freq: Every day | ORAL | 1 refills | Status: AC
Start: 2022-09-05 — End: 2023-03-10

## 2022-09-05 MED ORDER — MONTELUKAST SODIUM 10 MG PO TABS
10 MG | ORAL_TABLET | Freq: Every day | ORAL | 1 refills | Status: DC
Start: 2022-09-05 — End: 2022-09-05

## 2022-09-05 MED ORDER — METFORMIN HCL 1000 MG PO TABS
1000 MG | ORAL_TABLET | Freq: Two times a day (BID) | ORAL | 1 refills | Status: AC
Start: 2022-09-05 — End: 2023-03-10

## 2022-09-05 MED ORDER — METOPROLOL TARTRATE 25 MG PO TABS
25 MG | ORAL_TABLET | Freq: Two times a day (BID) | ORAL | 1 refills | Status: AC
Start: 2022-09-05 — End: 2023-03-10

## 2022-09-05 MED ORDER — METFORMIN HCL 1000 MG PO TABS
1000 MG | ORAL_TABLET | Freq: Two times a day (BID) | ORAL | 1 refills | Status: DC
Start: 2022-09-05 — End: 2022-09-05

## 2022-09-05 MED ORDER — METOPROLOL TARTRATE 25 MG PO TABS
25 MG | ORAL_TABLET | Freq: Two times a day (BID) | ORAL | 1 refills | Status: DC
Start: 2022-09-05 — End: 2022-09-05

## 2022-09-05 MED ORDER — DAPAGLIFLOZIN PROPANEDIOL 10 MG PO TABS
10 MG | ORAL_TABLET | Freq: Every morning | ORAL | 1 refills | Status: AC
Start: 2022-09-05 — End: 2023-03-10

## 2022-09-05 MED ORDER — SILDENAFIL CITRATE 25 MG PO TABS
25 MG | ORAL_TABLET | Freq: Every day | ORAL | 4 refills | Status: DC | PRN
Start: 2022-09-05 — End: 2022-09-05

## 2022-09-05 MED ORDER — ATORVASTATIN CALCIUM 80 MG PO TABS
80 MG | ORAL_TABLET | Freq: Every evening | ORAL | 1 refills | Status: DC
Start: 2022-09-05 — End: 2022-09-05

## 2022-09-05 MED ORDER — ATORVASTATIN CALCIUM 80 MG PO TABS
80 MG | ORAL_TABLET | Freq: Every evening | ORAL | 1 refills | Status: AC
Start: 2022-09-05 — End: 2023-03-10

## 2022-09-05 MED ORDER — DAPAGLIFLOZIN PROPANEDIOL 10 MG PO TABS
10 MG | ORAL_TABLET | Freq: Every morning | ORAL | 1 refills | Status: DC
Start: 2022-09-05 — End: 2022-09-05

## 2022-09-05 NOTE — Patient Instructions (Signed)
Please use Afrin nasal spray twice a day for 3 days for nose bleeds.

## 2022-09-05 NOTE — Progress Notes (Addendum)
Jaime Nash is a 62 y.o. male who was seen in clinic today (09/05/2022).    Assessment & Plan:   Below is the assessment and plan developed based on review of pertinent history, physical exam, labs, studies, and medications.    1. Mixed simple and mucopurulent chronic bronchitis ([place])  Comments:  stable, now seeing pulmonology.  Orders:  -     montelukast (SINGULAIR) 10 MG tablet; Take 1 tablet by mouth daily, Disp-90 tablet, R-1Normal  2. Essential (primary) hypertension  Comments:  well controlled  Orders:  -     metoprolol tartrate (LOPRESSOR) 25 MG tablet; Take 1 tablet by mouth 2 times daily, Disp-180 tablet, R-1ZERO refills remain on this prescription. Your patient is requesting advance approval of refills for this medication to [facility]  3. Type 2 diabetes mellitus with other specified complication, without long-term current use of insulin (HCC)  Comments:  stable.   will add on farxiga.  Orders:  -     CBC  -     Comprehensive Metabolic Panel  -     Lipid Panel  -     Hemoglobin A1C  -     dapagliflozin (FARXIGA) 10 MG tablet; Take 1 tablet by mouth every morning, Disp-90 tablet, R-1Normal  -     metFORMIN (GLUCOPHAGE) 1000 MG tablet; Take 1 tablet by mouth 2 times daily, Disp-180 tablet, R-1Normal  4. Mixed hyperlipidemia  -     atorvastatin (LIPITOR) 80 MG tablet; Take 1 tablet by mouth nightly, Disp-90 tablet, R-1Normal  5. Epistaxis      Return in about 6 months (around 03/06/2023) for Chronic OV.    Subjective:   Jaime Nash was seen today for 6 Month Follow-Up (Medication refills)     Hypertension: Patients hypertension is well controlled on regimen of metoprolol. Denies headaches, blurred vision or dizziness.   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  Had stent was placed in 2021, is now on ASA.     Diabetes: Last A1c was   Hemoglobin A1C   Date Value Ref Range Status   03/04/2022 7.0 (H) 4.8 - 5.6 % Final     Comment:               Prediabetes: 5.7 - 6.4           Diabetes: >6.4           Glycemic control for adults with diabetes: <7.0      Diabetes well controlled on metformin.  Patient's last eye exam was 2023, normal. Denies neuropathy.     Hyperlipidemia: Mixed hyperlipidemia well controlled on atorvastain.  Denies any complications from medications.      COPD:  Patient reported that he is now seeing Dr. Timoteo Ace, was switched trelegy.  Did have a partial collapsed lung in September, did not require treatment.  Patient quit smoking in 2015,.    Epistaxis: Patient c/o nosebleeds intermittently over the past few weeks.  Has been able to control with pressure.  Patient reported he has been blowing his nose more often.     Review of Systems   Constitutional:  Negative for fatigue.   HENT:  Positive for nosebleeds.    Eyes:  Negative for visual disturbance.   Respiratory:  Negative for shortness of breath.    Cardiovascular:  Negative for chest pain, palpitations and leg swelling.   Neurological:  Negative for dizziness, weakness and headaches.  Psychiatric/Behavioral:  Negative for sleep disturbance. The patient is not nervous/anxious.           Objective:     Vitals:    09/05/22 0900   BP: 107/70   Site: Right Upper Arm   Position: Sitting   Pulse: 67   Resp: 16   Temp: 97.3 F (36.3 C)   TempSrc: Oral   SpO2: 98%   Weight: 127 kg (280 lb)   Height: 1.829 m (6')      Body mass index is 37.97 kg/m.     Physical Exam  Constitutional:       Appearance: Normal appearance.   HENT:      Head: Normocephalic.      Nose:      Right Nostril: Epistaxis present.      Left Nostril: No epistaxis.      Right Turbinates: Enlarged.   Eyes:      Conjunctiva/sclera: Conjunctivae normal.   Cardiovascular:      Rate and Rhythm: Normal rate and regular rhythm.   Pulmonary:      Effort: Pulmonary effort is normal.      Breath sounds: Normal breath sounds.   Musculoskeletal:      Cervical back: Normal range of motion.   Skin:     General: Skin is warm and dry.    Neurological:      Mental Status: He is alert and oriented to person, place, and time.   Psychiatric:         Mood and Affect: Mood normal.         Behavior: Behavior normal.          No Known Allergies    Current Outpatient Medications   Medication Sig Dispense Refill    TRELEGY ELLIPTA 100-62.5-25 MCG/ACT AEPB inhaler       atorvastatin (LIPITOR) 80 MG tablet Take 1 tablet by mouth nightly 90 tablet 1    dapagliflozin (FARXIGA) 10 MG tablet Take 1 tablet by mouth every morning 90 tablet 1    metFORMIN (GLUCOPHAGE) 1000 MG tablet Take 1 tablet by mouth 2 times daily 180 tablet 1    metoprolol tartrate (LOPRESSOR) 25 MG tablet Take 1 tablet by mouth 2 times daily 180 tablet 1    montelukast (SINGULAIR) 10 MG tablet Take 1 tablet by mouth daily 90 tablet 1    nitroGLYCERIN (NITROSTAT) 0.4 MG SL tablet PLACE 1 TABLET UNDER THE TONGUE AS DIRECTED FOR CHEST PAIN      albuterol sulfate HFA (PROVENTIL;VENTOLIN;PROAIR) 108 (90 Base) MCG/ACT inhaler Inhale 1 puff into the lungs every 6 hours as needed for Shortness of Breath 18 g 2    aspirin 81 MG EC tablet Take 1 tablet by mouth daily      potassium gluconate 550 mg tablet Take 99 mg by mouth daily       No current facility-administered medications for this visit.       Reviewed and updated this visit:  Tobacco  Allergies  Meds  Problems  Med Hx  Surg Hx  Soc Hx  Fam Hx     Elyse Hsu, APRN - NP

## 2022-09-05 NOTE — Addendum Note (Signed)
Addended byOlive Bass on: 09/05/2022 09:29 AM     Modules accepted: Orders

## 2022-09-05 NOTE — Progress Notes (Signed)
Chief Complaint   Patient presents with    6 Month Follow-Up     Medication refills     "Have you been to the ER, urgent care clinic since your last visit?  Hospitalized since your last visit?"    NO    "Have you seen or consulted any other health care providers outside of [facility] since your last visit?"    NO  BP 107/70 (Site: Right Upper Arm, Position: Sitting)   Pulse 67   Temp 97.3 F (36.3 C) (Oral)   Resp 16   Ht 1.829 m (6')   Wt 127 kg (280 lb)   SpO2 98%   BMI 37.97 kg/m

## 2022-09-05 NOTE — Telephone Encounter (Signed)
Patient was checking out and under the impression that diabetic medication was going to be changed. Patient then stated since it wasn't this time it may be done next time, reassured patient it could just be Carlyn Reichert is waiting for the lab results.

## 2022-09-06 LAB — COMPREHENSIVE METABOLIC PANEL
ALT: 28 IU/L (ref 0–44)
AST: 29 IU/L (ref 0–40)
Albumin/Globulin Ratio: 2 (ref 1.2–2.2)
Albumin: 4.5 g/dL (ref 3.9–4.9)
Alkaline Phosphatase: 80 IU/L (ref 44–121)
BUN/Creatinine Ratio: 9 — ABNORMAL LOW (ref 10–24)
BUN: 7 mg/dL — ABNORMAL LOW (ref 8–27)
CO2: 24 mmol/L (ref 20–29)
Calcium: 9.2 mg/dL (ref 8.6–10.2)
Chloride: 98 mmol/L (ref 96–106)
Creatinine: 0.79 mg/dL (ref 0.76–1.27)
Est, Glomerular Filtration Rate: 101 mL/min/{1.73_m2} (ref >59)
Globulin, Total: 2.3 g/dL (ref 1.5–4.5)
Glucose: 121 mg/dL — ABNORMAL HIGH (ref 70–99)
Potassium: 4.9 mmol/L (ref 3.5–5.2)
Sodium: 140 mmol/L (ref 134–144)
Total Bilirubin: 0.9 mg/dL (ref 0.0–1.2)
Total Protein: 6.8 g/dL (ref 6.0–8.5)

## 2022-09-06 LAB — LIPID PANEL
Cholesterol: 124 mg/dL (ref 100–199)
HDL: 35 mg/dL — ABNORMAL LOW (ref >39)
LDL Calculated: 72 mg/dL (ref 0–99)
Triglycerides: 89 mg/dL (ref 0–149)
VLDL Cholesterol Calculated: 17 mg/dL (ref 5–40)

## 2022-09-06 LAB — CBC
Hematocrit: 43.9 % (ref 37.5–51.0)
Hemoglobin: 14.9 g/dL (ref 13.0–17.7)
MCH: 30.8 pg (ref 26.6–33.0)
MCHC: 33.9 g/dL (ref 31.5–35.7)
MCV: 91 fL (ref 79–97)
Platelets: 216 10*3/uL (ref 150–450)
RBC: 4.84 x10E6/uL (ref 4.14–5.80)
RDW: 12.7 % (ref 11.6–15.4)
WBC: 6.7 10*3/uL (ref 3.4–10.8)

## 2022-09-06 LAB — HEMOGLOBIN A1C: Hemoglobin A1C: 7.1 % — ABNORMAL HIGH (ref 4.8–5.6)

## 2022-09-12 ENCOUNTER — Telehealth

## 2022-09-12 MED ORDER — EMPAGLIFLOZIN 25 MG PO TABS
25 MG | ORAL_TABLET | Freq: Every day | ORAL | 1 refills | Status: AC
Start: 2022-09-12 — End: 2023-03-10

## 2022-09-12 NOTE — Telephone Encounter (Signed)
Switched to [employer].  Which will need a PA if you can please complete.

## 2022-09-12 NOTE — Addendum Note (Signed)
Addended byOlive Bass on: 09/12/2022 02:09 PM     Modules accepted: Orders

## 2022-09-12 NOTE — Telephone Encounter (Signed)
Jaime Nash not covered per pharmacy - would you like to change rx

## 2022-09-18 NOTE — Telephone Encounter (Signed)
Closed - Prior Authorization not required for patient/medication

## 2023-03-10 ENCOUNTER — Encounter: Admit: 2023-03-10 | Discharge: 2023-03-10 | Payer: BLUE CROSS/BLUE SHIELD | Attending: Family | Primary: Family

## 2023-03-10 DIAGNOSIS — K1370 Unspecified lesions of oral mucosa: Secondary | ICD-10-CM

## 2023-03-10 MED ORDER — ALPRAZOLAM 0.5 MG PO TABS
0.5 MG | ORAL_TABLET | ORAL | 0 refills | Status: AC
Start: 2023-03-10 — End: 2023-04-22

## 2023-03-10 MED ORDER — MONTELUKAST SODIUM 10 MG PO TABS
10 MG | ORAL_TABLET | Freq: Every day | ORAL | 1 refills | Status: DC
Start: 2023-03-10 — End: 2023-09-15

## 2023-03-10 MED ORDER — METOPROLOL TARTRATE 25 MG PO TABS
25 MG | ORAL_TABLET | Freq: Two times a day (BID) | ORAL | 1 refills | Status: DC
Start: 2023-03-10 — End: 2023-09-15

## 2023-03-10 MED ORDER — DAPAGLIFLOZIN PROPANEDIOL 10 MG PO TABS
10 MG | ORAL_TABLET | Freq: Every morning | ORAL | 1 refills | Status: DC
Start: 2023-03-10 — End: 2023-09-15

## 2023-03-10 MED ORDER — METFORMIN HCL 1000 MG PO TABS
1000 MG | ORAL_TABLET | Freq: Two times a day (BID) | ORAL | 1 refills | Status: DC
Start: 2023-03-10 — End: 2023-09-15

## 2023-03-10 MED ORDER — ATORVASTATIN CALCIUM 80 MG PO TABS
80 MG | ORAL_TABLET | Freq: Every evening | ORAL | 1 refills | Status: DC
Start: 2023-03-10 — End: 2023-09-15

## 2023-03-10 NOTE — Progress Notes (Signed)
Chief Complaint   Patient presents with    Follow-up     BP 108/62 (Site: Right Upper Arm, Position: Sitting)   Pulse 68   Temp 97.6 F (36.4 C) (Oral)   Resp 16   Ht 1.829 m (6')   Wt 122.5 kg (270 lb)   SpO2 96%   BMI 36.62 kg/m     "Have you been to the ER, urgent care clinic since your last visit?  Hospitalized since your last visit?"    NO    "Have you seen or consulted any other health care providers outside of [facility] since your last visit?"    NO            Click Here for Release of Records Request

## 2023-03-10 NOTE — Progress Notes (Signed)
Jaime Nash is a 62 y.o. male who was seen in clinic today (03/10/2023).    Assessment & Plan:   Below is the assessment and plan developed based on review of pertinent history, physical exam, labs, studies, and medications.    1. Oral lesion  Comments:  continue salt water gargles.   will check b12  Orders:  -     Vitamin B12  2. Mixed hyperlipidemia  -     atorvastatin (LIPITOR) 80 MG tablet; Take 1 tablet by mouth nightly, Disp-90 tablet, R-1Normal  -     Lipid Panel  3. Type 2 diabetes mellitus with other specified complication, without long-term current use of insulin (HCC)  Comments:  stable on current medications.  Orders:  -     dapagliflozin (FARXIGA) 10 MG tablet; Take 1 tablet by mouth every morning, Disp-90 tablet, R-1Normal  -     metFORMIN (GLUCOPHAGE) 1000 MG tablet; Take 1 tablet by mouth 2 times daily, Disp-180 tablet, R-1Normal  -     Microalbumin / Creatinine Urine Ratio  -     Hemoglobin A1C  -     Lipid Panel  -     CBC with Auto Differential  -     Comprehensive Metabolic Panel  4. Essential (primary) hypertension  Comments:  well controlled  Orders:  -     metoprolol tartrate (LOPRESSOR) 25 MG tablet; Take 1 tablet by mouth 2 times daily, Disp-180 tablet, R-1ZERO refills remain on this prescription. Your patient is requesting advance approval of refills for this medication to PREVENT ANY MISSED DOSESNormal  5. Mixed simple and mucopurulent chronic bronchitis (HCC)  Comments:  stable, now seeing pulmonology.  Orders:  -     montelukast (SINGULAIR) 10 MG tablet; Take 1 tablet by mouth daily, Disp-90 tablet, R-1Normal  6. Leg cramp  Comments:  will check labs.  Orders:  -     Magnesium  7. Severe obesity (BMI 35.0-39.9) with comorbidity (HCC)  -     Vitamin D 25 Hydroxy  8. Anxiety  Comments:  fear of flying, has a 7 hour flight in September.  xanax sent in.  Orders:  -     ALPRAZolam (XANAX) 0.5 MG tablet; Take 1 tablet by mouth 1 hour prior to flight.   May repeat every 4 hours as needed,  Disp-4 tablet, R-0Normal      Return in about 6 months (around 09/10/2023) for Chronic OV.    Subjective:   Jaime Nash was seen today for Follow-up     Hypertension: Patients hypertension is well controlled on regimen of metoprolol. Denies headaches, blurred vision or dizziness.   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  Had stent was placed in 2021, is now on ASA.     Diabetes: Last A1c was   Hemoglobin A1C   Date Value Ref Range Status   09/05/2022 7.1 (H) 4.8 - 5.6 % Final     Comment:                 Prediabetes: 5.7 - 6.4           Diabetes: >6.4           Glycemic control for adults with diabetes: <7.0        Diabetes well controlled on metformin.  Patient's last eye exam was 2024, normal. Denies neuropathy.     Hyperlipidemia: Mixed hyperlipidemia well controlled on atorvastain.  Denies any complications  from medications.      COPD:  Patient reported that he is now seeing Dr. Wynelle Nash, was switched trelegy.  Did have a partial collapsed lung in September 2023, did not require treatment.  Patient quit smoking in 2015,.    Patient reported that he is going on a long flight  to [place] later this year and has flight anxiety.  Patient requesting medication for his flight.     Patient has been having darker urine and leg cramps.     Review of Systems   Constitutional:  Negative for fatigue.   Eyes:  Negative for visual disturbance.   Respiratory:  Negative for shortness of breath.    Cardiovascular:  Negative for chest pain, palpitations and leg swelling.   Neurological:  Negative for dizziness, weakness and headaches.   Psychiatric/Behavioral:  Negative for sleep disturbance. The patient is not nervous/anxious.           Objective:     Vitals:    03/10/23 0701   BP: 108/62   Site: Right Upper Arm   Position: Sitting   Pulse: 68   Resp: 16   Temp: 97.6 F (36.4 C)   TempSrc: Oral   SpO2: 96%   Weight: 122.5 kg (270 lb)   Height: 1.829 m (6')      Body mass index is 36.62 kg/m.      Physical Exam  Constitutional:       Appearance: Normal appearance. He is obese.   HENT:      Head: Normocephalic.      Mouth/Throat:      Mouth: Oral lesions (under tongue.  NO erythema or drainage) present.   Eyes:      Conjunctiva/sclera: Conjunctivae normal.   Cardiovascular:      Rate and Rhythm: Normal rate and regular rhythm.      Pulses: Normal pulses.      Heart sounds: Normal heart sounds.   Pulmonary:      Effort: Pulmonary effort is normal.      Breath sounds: Normal breath sounds.   Musculoskeletal:      Cervical back: Normal range of motion.   Skin:     General: Skin is warm and dry.   Neurological:      Mental Status: He is alert and oriented to person, place, and time.   Psychiatric:         Mood and Affect: Mood normal.         Behavior: Behavior normal.          No Known Allergies    Current Outpatient Medications   Medication Sig Dispense Refill    atorvastatin (LIPITOR) 80 MG tablet Take 1 tablet by mouth nightly 90 tablet 1    dapagliflozin (FARXIGA) 10 MG tablet Take 1 tablet by mouth every morning 90 tablet 1    metFORMIN (GLUCOPHAGE) 1000 MG tablet Take 1 tablet by mouth 2 times daily 180 tablet 1    metoprolol tartrate (LOPRESSOR) 25 MG tablet Take 1 tablet by mouth 2 times daily 180 tablet 1    montelukast (SINGULAIR) 10 MG tablet Take 1 tablet by mouth daily 90 tablet 1    ALPRAZolam (XANAX) 0.5 MG tablet Take 1 tablet by mouth 1 hour prior to flight.   May repeat every 4 hours as needed 4 tablet 0    TRELEGY ELLIPTA 100-62.5-25 MCG/ACT AEPB inhaler       nitroGLYCERIN (NITROSTAT) 0.4 MG SL tablet PLACE 1 TABLET UNDER THE TONGUE  AS DIRECTED FOR CHEST PAIN      albuterol sulfate HFA (PROVENTIL;VENTOLIN;PROAIR) 108 (90 Base) MCG/ACT inhaler Inhale 1 puff into the lungs every 6 hours as needed for Shortness of Breath 18 g 2    aspirin 81 MG EC tablet Take 1 tablet by mouth daily      potassium gluconate 550 mg tablet Take 99 mg by mouth daily       No current facility-administered  medications for this visit.       Reviewed and updated this visit:  Tobacco  Allergies  Meds  Problems  Med Hx  Surg Hx  Soc Hx  Fam Hx     Court Joy, APRN - NP

## 2023-03-11 LAB — CBC WITH AUTO DIFFERENTIAL
Basophils %: 1 %
Basophils Absolute: 0.1 10*3/uL (ref 0.0–0.2)
Eosinophils %: 4 %
Eosinophils Absolute: 0.2 10*3/uL (ref 0.0–0.4)
Hematocrit: 44.3 % (ref 37.5–51.0)
Hemoglobin: 14.6 g/dL (ref 13.0–17.7)
Immature Grans (Abs): 0 10*3/uL (ref 0.0–0.1)
Immature Granulocytes %: 0 %
Lymphocytes %: 24 %
Lymphocytes Absolute: 1.3 10*3/uL (ref 0.7–3.1)
MCH: 30.4 pg (ref 26.6–33.0)
MCHC: 33 g/dL (ref 31.5–35.7)
MCV: 92 fL (ref 79–97)
Monocytes %: 8 %
Monocytes Absolute: 0.4 10*3/uL (ref 0.1–0.9)
Neutrophils %: 63 %
Neutrophils Absolute: 3.5 10*3/uL (ref 1.4–7.0)
Platelets: 192 10*3/uL (ref 150–450)
RBC: 4.8 x10E6/uL (ref 4.14–5.80)
RDW: 13.2 % (ref 11.6–15.4)
WBC: 5.6 10*3/uL (ref 3.4–10.8)

## 2023-03-11 LAB — COMPREHENSIVE METABOLIC PANEL
ALT: 22 IU/L (ref 0–44)
AST: 21 IU/L (ref 0–40)
Albumin: 4.3 g/dL (ref 3.9–4.9)
Alkaline Phosphatase: 68 IU/L (ref 44–121)
BUN/Creatinine Ratio: 14 (ref 10–24)
BUN: 10 mg/dL (ref 8–27)
CO2: 25 mmol/L (ref 20–29)
Calcium: 8.9 mg/dL (ref 8.6–10.2)
Chloride: 101 mmol/L (ref 96–106)
Creatinine: 0.7 mg/dL — ABNORMAL LOW (ref 0.76–1.27)
Est, Glom Filt Rate: 104 mL/min/{1.73_m2} (ref >59)
Globulin, Total: 2 g/dL (ref 1.5–4.5)
Glucose: 116 mg/dL — ABNORMAL HIGH (ref 70–99)
Potassium: 4.4 mmol/L (ref 3.5–5.2)
Sodium: 141 mmol/L (ref 134–144)
Total Bilirubin: 0.8 mg/dL (ref 0.0–1.2)
Total Protein: 6.3 g/dL (ref 6.0–8.5)

## 2023-03-11 LAB — HEMOGLOBIN A1C: Hemoglobin A1C: 6.7 % — ABNORMAL HIGH (ref 4.8–5.6)

## 2023-03-11 LAB — LIPID PANEL
Cholesterol, Total: 108 mg/dL (ref 100–199)
HDL: 33 mg/dL — ABNORMAL LOW (ref >39)
LDL Cholesterol: 57 mg/dL (ref 0–99)
Triglycerides: 91 mg/dL (ref 0–149)
VLDL Cholesterol Calculated: 18 mg/dL (ref 5–40)

## 2023-03-11 LAB — VITAMIN B12: Vitamin B-12: 242 pg/mL (ref 232–1245)

## 2023-03-11 LAB — VITAMIN D 25 HYDROXY: Vit D, 25-Hydroxy: 28.4 ng/mL — ABNORMAL LOW (ref 30.0–100.0)

## 2023-03-11 LAB — MAGNESIUM: Magnesium: 1.8 mg/dL (ref 1.6–2.3)

## 2023-03-12 LAB — MICROALBUMIN / CREATININE URINE RATIO
Albumin, U: 12.2 ug/mL
Creatinine, Ur: 102.8 mg/dL
Microalb/Creat Ratio: 12 mg/g creat (ref 0–29)

## 2023-03-13 NOTE — Telephone Encounter (Signed)
Please follow up with pharmacy on this.  Dapagliflozin is the generic for farxiga?  Unclear why they would say it is an alternative.    Doesn't look like a PA was needed?

## 2023-03-13 NOTE — Telephone Encounter (Signed)
[facility] sent a drug change request for RX-Dapagliflozin 10mg  tablets, requesting alternatives be farxiga, jardicance or steglatro

## 2023-03-13 NOTE — Telephone Encounter (Signed)
Spoke with pharmacy, states that Insurance will only cover Brand and that the fax comes from system before pharmacy can change to name brand. Pt has been notified. No change is needed.

## 2023-09-15 ENCOUNTER — Ambulatory Visit: Admit: 2023-09-15 | Discharge: 2023-09-15 | Payer: BLUE CROSS/BLUE SHIELD | Attending: Family | Primary: Family

## 2023-09-15 VITALS — BP 112/68 | HR 61 | Temp 97.90000°F | Resp 16 | Ht 72.0 in | Wt 272.0 lb

## 2023-09-15 DIAGNOSIS — E1169 Type 2 diabetes mellitus with other specified complication: Secondary | ICD-10-CM

## 2023-09-15 MED ORDER — MONTELUKAST SODIUM 10 MG PO TABS
10 | ORAL_TABLET | Freq: Every day | ORAL | 1 refills | 90.00000 days | Status: DC
Start: 2023-09-15 — End: 2024-03-11

## 2023-09-15 MED ORDER — METOPROLOL TARTRATE 25 MG PO TABS
25 | ORAL_TABLET | Freq: Two times a day (BID) | ORAL | 1 refills | 30.00000 days | Status: DC
Start: 2023-09-15 — End: 2024-03-12

## 2023-09-15 MED ORDER — DAPAGLIFLOZIN PROPANEDIOL 10 MG PO TABS
10 | ORAL_TABLET | Freq: Every morning | ORAL | 1 refills | 60.00000 days | Status: DC
Start: 2023-09-15 — End: 2024-03-15

## 2023-09-15 MED ORDER — METFORMIN HCL 1000 MG PO TABS
1000 | ORAL_TABLET | Freq: Two times a day (BID) | ORAL | 1 refills | Status: DC
Start: 2023-09-15 — End: 2024-03-22

## 2023-09-15 MED ORDER — ATORVASTATIN CALCIUM 80 MG PO TABS
80 | ORAL_TABLET | Freq: Every evening | ORAL | 1 refills | 90.00000 days | Status: DC
Start: 2023-09-15 — End: 2024-03-22

## 2023-09-15 NOTE — Progress Notes (Signed)
Chief Complaint   Patient presents with    6 Month Follow-Up     BP 112/68 (Site: Right Upper Arm, Position: Sitting)   Pulse 61   Temp 97.9 F (36.6 C) (Oral)   Resp 16   Ht 1.88 m (6\' 2" )   Wt 123.4 kg (272 lb)   SpO2 94%   BMI 34.92 kg/m     "Have you been to the ER, urgent care clinic since your last visit?  Hospitalized since your last visit?"    NO    "Have you seen or consulted any other health care providers outside our system since your last visit?"    NO      "Have you had a diabetic eye exam?"    NO     No diabetic eye exam on file

## 2023-09-15 NOTE — Progress Notes (Signed)
Jaime Nash is a 63 y.o. male who was seen in clinic today (09/15/2023).    Assessment & Plan:   Below is the assessment and plan developed based on review of pertinent history, physical exam, labs, studies, and medications.    1. Type 2 diabetes mellitus with other specified complication, without long-term current use of insulin (HCC)  Comments:  chronic, stable on medications.  will monitor.  Orders:  -     metFORMIN (GLUCOPHAGE) 1000 MG tablet; Take 1 tablet by mouth 2 times daily, Disp-180 tablet, R-1Normal  -     dapagliflozin (FARXIGA) 10 MG tablet; Take 1 tablet by mouth every morning, Disp-90 tablet, R-1Normal  -     CBC  -     Comprehensive Metabolic Panel  -     Hemoglobin A1C  -     Lipid Panel  2. Mixed simple and mucopurulent chronic bronchitis (HCC)  Comments:  stable, now seeing pulmonology.  Orders:  -     montelukast (SINGULAIR) 10 MG tablet; Take 1 tablet by mouth daily, Disp-90 tablet, R-1Normal  3. Essential (primary) hypertension  Comments:  well controlled  Orders:  -     metoprolol tartrate (LOPRESSOR) 25 MG tablet; Take 1 tablet by mouth 2 times daily, Disp-180 tablet, R-1ZERO refills remain on this prescription. Your patient is requesting advance approval of refills for this medication to PREVENT ANY MISSED DOSESNormal  4. Mixed hyperlipidemia  Comments:  chronic, stable.  will monitor labs  Orders:  -     atorvastatin (LIPITOR) 80 MG tablet; Take 1 tablet by mouth nightly, Disp-90 tablet, R-1Normal  5. Vitamin D deficiency  Comments:  will monitor labs.  Orders:  -     Vitamin D 25 Hydroxy      Return in about 6 months (around 03/14/2024) for Chronic OV.    Subjective:   Jaime Nash was seen today for 6 Month Follow-Up     Hypertension: Patients hypertension is well controlled on regimen of metoprolol. Denies headaches, blurred vision or dizziness.   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  Had stent was placed in 2021, is now on ASA.     Diabetes:  Last A1c was   Hemoglobin A1C   Date Value Ref Range Status   03/10/2023 6.7 (H) 4.8 - 5.6 % Final     Comment:                 Prediabetes: 5.7 - 6.4           Diabetes: >6.4           Glycemic control for adults with diabetes: <7.0     Diabetes well controlled on metformin.  Patient's last eye exam was 2024, normal. Denies neuropathy.    Hyperlipidemia: Mixed hyperlipidemia well controlled on atorvastain.  Denies any complications from medications.      COPD:  Patient reported that he is now seeing Dr. Wynelle Fanny, was switched trelegy.  Did have a partial collapsed lung in September 2023, did not require treatment.  Patient quit smoking in 2015,.    Follows with Dr. Glendon Axe for history of MI and CAD.    Review of Systems   Constitutional:  Negative for fatigue.   Eyes:  Negative for visual disturbance.   Respiratory:  Negative for shortness of breath.    Cardiovascular:  Negative for chest pain, palpitations and leg swelling.   Neurological:  Negative for dizziness, weakness and  headaches.   Psychiatric/Behavioral:  Negative for sleep disturbance. The patient is not nervous/anxious.           Objective:     Vitals:    09/15/23 0749   BP: 112/68   Site: Right Upper Arm   Position: Sitting   Pulse: 61   Resp: 16   Temp: 97.9 F (36.6 C)   TempSrc: Oral   SpO2: 94%   Weight: 123.4 kg (272 lb)   Height: 1.829 m (6')      Body mass index is 36.89 kg/m.     Physical Exam  Constitutional:       Appearance: Normal appearance.   HENT:      Head: Normocephalic.   Eyes:      Conjunctiva/sclera: Conjunctivae normal.   Cardiovascular:      Rate and Rhythm: Normal rate and regular rhythm.   Pulmonary:      Effort: Pulmonary effort is normal.      Breath sounds: Normal breath sounds.   Musculoskeletal:      Cervical back: Normal range of motion.   Skin:     General: Skin is warm and dry.   Neurological:      Mental Status: He is alert and oriented to person, place, and time.   Psychiatric:         Mood and Affect: Mood normal.          Behavior: Behavior normal.          No Known Allergies    Current Outpatient Medications   Medication Sig Dispense Refill    Vitamin D (CHOLECALCIFEROL) 25 MCG (1000 UT) TABS tablet Take 1 tablet by mouth daily      montelukast (SINGULAIR) 10 MG tablet Take 1 tablet by mouth daily 90 tablet 1    metoprolol tartrate (LOPRESSOR) 25 MG tablet Take 1 tablet by mouth 2 times daily 180 tablet 1    metFORMIN (GLUCOPHAGE) 1000 MG tablet Take 1 tablet by mouth 2 times daily 180 tablet 1    dapagliflozin (FARXIGA) 10 MG tablet Take 1 tablet by mouth every morning 90 tablet 1    atorvastatin (LIPITOR) 80 MG tablet Take 1 tablet by mouth nightly 90 tablet 1    TRELEGY ELLIPTA 100-62.5-25 MCG/ACT AEPB inhaler       nitroGLYCERIN (NITROSTAT) 0.4 MG SL tablet PLACE 1 TABLET UNDER THE TONGUE AS DIRECTED FOR CHEST PAIN      albuterol sulfate HFA (PROVENTIL;VENTOLIN;PROAIR) 108 (90 Base) MCG/ACT inhaler Inhale 1 puff into the lungs every 6 hours as needed for Shortness of Breath 18 g 2    aspirin 81 MG EC tablet Take 1 tablet by mouth daily      potassium gluconate 550 mg tablet Take 99 mg by mouth daily       No current facility-administered medications for this visit.       Reviewed and updated this visit:  Tobacco  Allergies  Meds  Problems  Med Hx  Surg Hx  Soc Hx  Fam Hx     Court Joy, APRN - NP

## 2023-09-16 LAB — VITAMIN D 25 HYDROXY: Vit D, 25-Hydroxy: 33.3 ng/mL (ref 30.0–100.0)

## 2023-09-16 LAB — COMPREHENSIVE METABOLIC PANEL
ALT: 23 [IU]/L (ref 0–44)
AST: 17 [IU]/L (ref 0–40)
Albumin: 4.3 g/dL (ref 3.9–4.9)
Alkaline Phosphatase: 73 [IU]/L (ref 44–121)
BUN/Creatinine Ratio: 10 (ref 10–24)
BUN: 7 mg/dL — ABNORMAL LOW (ref 8–27)
CO2: 26 mmol/L (ref 20–29)
Calcium: 9 mg/dL (ref 8.6–10.2)
Chloride: 99 mmol/L (ref 96–106)
Creatinine: 0.73 mg/dL — ABNORMAL LOW (ref 0.76–1.27)
Est, Glom Filt Rate: 103 mL/min/{1.73_m2} (ref >59)
Globulin, Total: 2.5 g/dL (ref 1.5–4.5)
Glucose: 116 mg/dL — ABNORMAL HIGH (ref 70–99)
Potassium: 4.2 mmol/L (ref 3.5–5.2)
Sodium: 138 mmol/L (ref 134–144)
Total Bilirubin: 1 mg/dL (ref 0.0–1.2)
Total Protein: 6.8 g/dL (ref 6.0–8.5)

## 2023-09-16 LAB — LIPID PANEL
Cholesterol, Total: 120 mg/dL (ref 100–199)
HDL: 35 mg/dL — ABNORMAL LOW (ref >39)
LDL Cholesterol: 66 mg/dL (ref 0–99)
Triglycerides: 97 mg/dL (ref 0–149)
VLDL Cholesterol Calculated: 19 mg/dL (ref 5–40)

## 2023-09-16 LAB — CBC
Hematocrit: 46.6 % (ref 37.5–51.0)
Hemoglobin: 15.6 g/dL (ref 13.0–17.7)
MCH: 30.8 pg (ref 26.6–33.0)
MCHC: 33.5 g/dL (ref 31.5–35.7)
MCV: 92 fL (ref 79–97)
Platelets: 208 10*3/uL (ref 150–450)
RBC: 5.06 x10E6/uL (ref 4.14–5.80)
RDW: 12.6 % (ref 11.6–15.4)
WBC: 6.5 10*3/uL (ref 3.4–10.8)

## 2023-09-16 LAB — HEMOGLOBIN A1C: Hemoglobin A1C: 6.7 % — ABNORMAL HIGH (ref 4.8–5.6)

## 2024-03-11 ENCOUNTER — Encounter

## 2024-03-11 MED ORDER — MONTELUKAST SODIUM 10 MG PO TABS
10 | ORAL_TABLET | Freq: Every day | ORAL | 1 refills | 90.00000 days | Status: DC
Start: 2024-03-11 — End: 2024-03-22

## 2024-03-12 ENCOUNTER — Encounter

## 2024-03-12 MED ORDER — METOPROLOL TARTRATE 25 MG PO TABS
25 | ORAL_TABLET | Freq: Two times a day (BID) | ORAL | 1 refills | 90.00000 days | Status: DC
Start: 2024-03-12 — End: 2024-09-07

## 2024-03-15 MED ORDER — DAPAGLIFLOZIN PROPANEDIOL 10 MG PO TABS
10 | ORAL_TABLET | Freq: Every morning | ORAL | 1 refills | 90.00000 days | Status: DC
Start: 2024-03-15 — End: 2024-03-22

## 2024-03-22 ENCOUNTER — Ambulatory Visit: Admit: 2024-03-22 | Discharge: 2024-03-22 | Payer: BLUE CROSS/BLUE SHIELD | Attending: Family | Primary: Family

## 2024-03-22 DIAGNOSIS — E1169 Type 2 diabetes mellitus with other specified complication: Principal | ICD-10-CM

## 2024-03-22 MED ORDER — ATORVASTATIN CALCIUM 80 MG PO TABS
80 | ORAL_TABLET | Freq: Every evening | ORAL | 1 refills | 90.00000 days | Status: AC
Start: 2024-03-22 — End: ?

## 2024-03-22 MED ORDER — METFORMIN HCL 1000 MG PO TABS
1000 | ORAL_TABLET | Freq: Two times a day (BID) | ORAL | 1 refills | 90.00000 days | Status: DC
Start: 2024-03-22 — End: 2024-09-15

## 2024-03-22 MED ORDER — MONTELUKAST SODIUM 10 MG PO TABS
10 | ORAL_TABLET | Freq: Every day | ORAL | 1 refills | 90.00000 days | Status: AC
Start: 2024-03-22 — End: ?

## 2024-03-22 MED ORDER — DAPAGLIFLOZIN PROPANEDIOL 10 MG PO TABS
10 | ORAL_TABLET | Freq: Every morning | ORAL | 1 refills | 90.00000 days | Status: AC
Start: 2024-03-22 — End: ?

## 2024-03-22 NOTE — Progress Notes (Signed)
 Jaime Nash is a 63 y.o. male who was seen in clinic today (03/22/2024).    Assessment & Plan:   Below is the assessment and plan developed based on review of pertinent history, physical exam, labs, studies, and medications.    1. Type 2 diabetes mellitus with other specified complication, without long-term current use of insulin (HCC)  Comments:  chronic, stable on medications.  will monitor.  Orders:  -     dapagliflozin  (FARXIGA ) 10 MG tablet; Take 1 tablet by mouth every morning, Disp-90 tablet, R-1Normal  -     metFORMIN  (GLUCOPHAGE ) 1000 MG tablet; Take 1 tablet by mouth 2 times daily, Disp-180 tablet, R-1Normal  -     CBC  -     Hemoglobin A1C  -     TSH  -     Albumin/Creatinine Ratio, Urine  2. Mixed hyperlipidemia  Comments:  chronic, stable.  will monitor labs  Orders:  -     atorvastatin  (LIPITOR) 80 MG tablet; Take 1 tablet by mouth nightly, Disp-90 tablet, R-1Normal  -     Comprehensive Metabolic Panel  -     Lipid Panel  3. Mixed simple and mucopurulent chronic bronchitis (HCC)  Comments:  stable, now seeing pulmonology.  Orders:  -     montelukast  (SINGULAIR ) 10 MG tablet; Take 1 tablet by mouth daily, Disp-90 tablet, R-1Normal  4. Vitamin D deficiency  Comments:  will monitor labs since starting on supplement.  Orders:  -     Vitamin D 25 Hydroxy  5. Colon cancer screening declined  6. Screening for prostate cancer  -     PSA Screening  7. Immunization due  Comments:  discussed recommended vaccinations but he declines today      Return in about 6 months (around 09/22/2024) for Chronic OV.    Subjective:   Jaime Nash was seen today for Follow-up and Medication Refill     Hypertension: Patients hypertension is well controlled on regimen of metoprolol . Denies headaches, blurred vision or dizziness.   Patient also had quad CABG in 2015 for which he continues to use metoprolol  for.  Had stent was placed in 2021, is now on ASA.     Diabetes: Last A1c was   Hemoglobin A1C   Date Value Ref Range Status    09/15/2023 6.7 (H) 4.8 - 5.6 % Final     Comment:                 Prediabetes: 5.7 - 6.4           Diabetes: >6.4           Glycemic control for adults with diabetes: <7.0     Diabetes well controlled on metformin .  Patient's last eye exam was 2024, normal. Denies neuropathy.     Hyperlipidemia: Mixed hyperlipidemia well controlled on atorvastain.  Denies any complications from medications.      COPD:  Patient reported that he is now seeing Dr. Sarna, was switched trelegy.  Did have a partial collapsed lung in September 2023, did not require treatment.  Patient quit smoking in 2015,.     Follows with Dr. Gerrie for history of MI and CAD.    Review of Systems   Constitutional:  Negative for fatigue.   Eyes:  Negative for visual disturbance.   Respiratory:  Negative for shortness of breath.    Cardiovascular:  Negative for chest pain, palpitations and leg swelling.   Neurological:  Negative for dizziness, weakness  and headaches.   Psychiatric/Behavioral:  Negative for sleep disturbance. The patient is not nervous/anxious.           Objective:     Vitals:    03/22/24 1041   BP: 110/68   BP Site: Right Upper Arm   Patient Position: Sitting   Pulse: 59   Resp: 16   Temp: 98 F (36.7 C)   TempSrc: Oral   SpO2: 94%   Weight: 117 kg (258 lb)   Height: 1.829 m (6')      Body mass index is 34.99 kg/m.     Physical Exam  Constitutional:       Appearance: Normal appearance.   HENT:      Head: Normocephalic.   Eyes:      Conjunctiva/sclera: Conjunctivae normal.   Cardiovascular:      Rate and Rhythm: Normal rate and regular rhythm.      Pulses: Normal pulses.      Heart sounds: Normal heart sounds.   Pulmonary:      Effort: Pulmonary effort is normal.      Breath sounds: Normal breath sounds.   Musculoskeletal:      Cervical back: Normal range of motion.   Skin:     General: Skin is warm and dry.   Neurological:      Mental Status: He is alert and oriented to person, place, and time.   Psychiatric:         Mood and Affect:  Mood normal.         Behavior: Behavior normal.          No Known Allergies    Current Outpatient Medications   Medication Sig Dispense Refill    atorvastatin  (LIPITOR) 80 MG tablet Take 1 tablet by mouth nightly 90 tablet 1    dapagliflozin  (FARXIGA ) 10 MG tablet Take 1 tablet by mouth every morning 90 tablet 1    metFORMIN  (GLUCOPHAGE ) 1000 MG tablet Take 1 tablet by mouth 2 times daily 180 tablet 1    montelukast  (SINGULAIR ) 10 MG tablet Take 1 tablet by mouth daily 90 tablet 1    metoprolol  tartrate (LOPRESSOR ) 25 MG tablet TAKE 1 TABLET BY MOUTH TWICE DAILY (Patient taking differently: Take 1 tablet by mouth 2 times daily 1/2 tablet twice daily) 180 tablet 1    Vitamin D (CHOLECALCIFEROL) 25 MCG (1000 UT) TABS tablet Take 1 tablet by mouth daily      TRELEGY ELLIPTA 100-62.5-25 MCG/ACT AEPB inhaler       nitroGLYCERIN (NITROSTAT) 0.4 MG SL tablet PLACE 1 TABLET UNDER THE TONGUE AS DIRECTED FOR CHEST PAIN      albuterol  sulfate HFA (PROVENTIL ;VENTOLIN ;PROAIR ) 108 (90 Base) MCG/ACT inhaler Inhale 1 puff into the lungs every 6 hours as needed for Shortness of Breath 18 g 2    aspirin 81 MG EC tablet Take 1 tablet by mouth daily      potassium gluconate 550 mg tablet Take 99 mg by mouth daily       No current facility-administered medications for this visit.       Reviewed and updated this visit:  Tobacco  Allergies  Meds  Problems  Med Hx  Surg Hx  Soc Hx  Fam Hx     Gerri LITTIE Chang, APRN - NP

## 2024-03-22 NOTE — Progress Notes (Signed)
 Chief Complaint   Patient presents with    Follow-up    Medication Refill     BP 110/68 (BP Site: Right Upper Arm, Patient Position: Sitting)   Pulse 59   Temp 98 F (36.7 C) (Oral)   Resp 16   Ht 1.829 m (6')   Wt 117 kg (258 lb)   SpO2 94%   BMI 34.99 kg/m     Have you been to the ER, urgent care clinic since your last visit?  Hospitalized since your last visit?   NO    Have you seen or consulted any other health care providers outside our system since your last visit?   NO      "Have you had a colorectal cancer screening such as a colonoscopy/FIT/Cologuard?    yes    No colonoscopy on file  Date of last Cologuard: 01/18/2021  No FIT/FOBT on file   No flexible sigmoidoscopy on file     "Have you had a diabetic eye exam?"    YES - Where: eye doctor Nurse/CMA to request most recent records if not in the chart     No diabetic eye exam on file

## 2024-03-23 LAB — COMPREHENSIVE METABOLIC PANEL
ALT: 21 IU/L (ref 0–44)
AST: 18 IU/L (ref 0–40)
Albumin: 4.7 g/dL (ref 3.9–4.9)
Alkaline Phosphatase: 81 IU/L (ref 44–121)
BUN/Creatinine Ratio: 14 (ref 10–24)
BUN: 9 mg/dL (ref 8–27)
CO2: 21 mmol/L (ref 20–29)
Calcium: 9.2 mg/dL (ref 8.6–10.2)
Chloride: 101 mmol/L (ref 96–106)
Creatinine: 0.66 mg/dL — ABNORMAL LOW (ref 0.76–1.27)
Est, Glom Filt Rate: 105 mL/min/1.73 (ref >59)
Globulin, Total: 2.4 g/dL (ref 1.5–4.5)
Glucose: 89 mg/dL (ref 70–99)
Potassium: 4.4 mmol/L (ref 3.5–5.2)
Sodium: 140 mmol/L (ref 134–144)
Total Bilirubin: 1.2 mg/dL (ref 0.0–1.2)
Total Protein: 7.1 g/dL (ref 6.0–8.5)

## 2024-03-23 LAB — CBC
Hematocrit: 48.6 % (ref 37.5–51.0)
Hemoglobin: 15.7 g/dL (ref 13.0–17.7)
MCH: 30.9 pg (ref 26.6–33.0)
MCHC: 32.3 g/dL (ref 31.5–35.7)
MCV: 96 fL (ref 79–97)
Platelets: 204 x10E3/uL (ref 150–450)
RBC: 5.08 x10E6/uL (ref 4.14–5.80)
RDW: 12.9 % (ref 11.6–15.4)
WBC: 7 x10E3/uL (ref 3.4–10.8)

## 2024-03-23 LAB — LIPID PANEL
Cholesterol, Total: 110 mg/dL (ref 100–199)
HDL: 34 mg/dL — ABNORMAL LOW (ref >39)
LDL Cholesterol: 57 mg/dL (ref 0–99)
Triglycerides: 100 mg/dL (ref 0–149)
VLDL Cholesterol Calculated: 19 mg/dL (ref 5–40)

## 2024-03-23 LAB — ALBUMIN/CREATININE RATIO, URINE
Albumin Urine: 5.8 ug/mL
Albumin/Creatinine Ratio: 9 mg/g{creat} (ref 0–29)
Creatinine, Ur: 68.2 mg/dL

## 2024-03-23 LAB — TSH: TSH: 1.46 u[IU]/mL (ref 0.450–4.500)

## 2024-03-23 LAB — HEMOGLOBIN A1C
Estimated Avg Glucose: 137 mg/dL
Hemoglobin A1C: 6.4 % — ABNORMAL HIGH (ref 4.8–5.6)

## 2024-03-23 LAB — PSA SCREENING: PSA: 0.4 ng/mL (ref 0.0–4.0)

## 2024-03-23 LAB — VITAMIN D 25 HYDROXY: Vit D, 25-Hydroxy: 33.6 ng/mL (ref 30.0–100.0)

## 2024-03-25 NOTE — Telephone Encounter (Signed)
 Please print a copy of his lab letter and leave up front as he wishes to pick up a copy of his labs.   Please give him a call to notify him when completed.

## 2024-04-01 NOTE — Telephone Encounter (Signed)
 This medication is being prescribed/managed by his pulmonologist so I would ask he follow up with them for refills.

## 2024-04-01 NOTE — Telephone Encounter (Signed)
 Jaime Nash is requesting a refill on  trelegy     Next visit is scheduled for 09/27/2024   Last visit : 03/22/2024     Send to :   [id number], [place] - [address] [phone] [address] [phone]  [address] [place] [address]  Phone: [phone] Fax: [phone]

## 2024-04-01 NOTE — Telephone Encounter (Signed)
 Called and s/w patient and gave NP recc. Pt verbalized understanding. No further questions at this time.

## 2024-09-07 ENCOUNTER — Encounter

## 2024-09-07 MED ORDER — METOPROLOL TARTRATE 25 MG PO TABS
25 | ORAL_TABLET | Freq: Two times a day (BID) | ORAL | 1 refills | 90.00000 days | Status: AC
Start: 2024-09-07 — End: ?

## 2024-09-07 NOTE — Telephone Encounter (Signed)
 Refill

## 2024-09-15 ENCOUNTER — Encounter

## 2024-09-15 MED ORDER — METFORMIN HCL 1000 MG PO TABS
1000 | ORAL_TABLET | Freq: Two times a day (BID) | ORAL | 1 refills | Status: AC
Start: 2024-09-15 — End: ?

## 2024-09-27 ENCOUNTER — Encounter: Payer: BLUE CROSS/BLUE SHIELD | Attending: Family | Primary: Family
